# Patient Record
Sex: Male | Born: 1935 | Race: White | Hispanic: No | Marital: Married | Smoking: Never smoker
Health system: Southern US, Community
[De-identification: ages and names within clinical notes are randomized; demographics above are authoritative.]

## PROBLEM LIST (undated history)

## (undated) DIAGNOSIS — R591 Generalized enlarged lymph nodes: Secondary | ICD-10-CM

## (undated) DIAGNOSIS — I712 Thoracic aortic aneurysm, without rupture, unspecified: Secondary | ICD-10-CM

## (undated) DIAGNOSIS — E291 Testicular hypofunction: Secondary | ICD-10-CM

## (undated) DIAGNOSIS — R21 Rash and other nonspecific skin eruption: Secondary | ICD-10-CM

## (undated) DIAGNOSIS — D649 Anemia, unspecified: Secondary | ICD-10-CM

## (undated) DIAGNOSIS — M549 Dorsalgia, unspecified: Secondary | ICD-10-CM

## (undated) DIAGNOSIS — M1990 Unspecified osteoarthritis, unspecified site: Secondary | ICD-10-CM

## (undated) DIAGNOSIS — C61 Malignant neoplasm of prostate: Secondary | ICD-10-CM

## (undated) DIAGNOSIS — I7090 Unspecified atherosclerosis: Secondary | ICD-10-CM

## (undated) DIAGNOSIS — E785 Hyperlipidemia, unspecified: Secondary | ICD-10-CM

## (undated) DIAGNOSIS — R931 Abnormal findings on diagnostic imaging of heart and coronary circulation: Secondary | ICD-10-CM

## (undated) DIAGNOSIS — E669 Obesity, unspecified: Secondary | ICD-10-CM

## (undated) DIAGNOSIS — M8580 Other specified disorders of bone density and structure, unspecified site: Secondary | ICD-10-CM

## (undated) HISTORY — DX: Abnormal findings on diagnostic imaging of heart and coronary circulation: R93.1

## (undated) HISTORY — DX: Anemia, unspecified: D64.9

## (undated) HISTORY — DX: Rash and other nonspecific skin eruption: R21

## (undated) HISTORY — PX: OTHER SURGICAL HISTORY: SHX169

## (undated) HISTORY — DX: Generalized enlarged lymph nodes: R59.1

## (undated) HISTORY — DX: Unspecified atherosclerosis: I70.90

## (undated) HISTORY — DX: Malignant neoplasm of prostate: C61

## (undated) HISTORY — PX: BELPHAROPTOSIS REPAIR: SHX369

## (undated) HISTORY — DX: Other specified disorders of bone density and structure, unspecified site: M85.80

## (undated) HISTORY — DX: Unspecified osteoarthritis, unspecified site: M19.90

## (undated) HISTORY — DX: Dorsalgia, unspecified: M54.9

## (undated) HISTORY — PX: BILATERAL KNEE ARTHROSCOPY: SUR91

## (undated) HISTORY — DX: Thoracic aortic aneurysm, without rupture, unspecified: I71.20

## (undated) HISTORY — DX: Thoracic aortic aneurysm, without rupture: I71.2

## (undated) HISTORY — DX: Testicular hypofunction: E29.1

## (undated) HISTORY — DX: Hyperlipidemia, unspecified: E78.5

## (undated) HISTORY — DX: Obesity, unspecified: E66.9

---

## 1998-09-12 ENCOUNTER — Ambulatory Visit (HOSPITAL_COMMUNITY): Admission: RE | Admit: 1998-09-12 | Discharge: 1998-09-12 | Payer: Self-pay | Admitting: Neurosurgery

## 1998-09-12 ENCOUNTER — Encounter: Payer: Self-pay | Admitting: Neurosurgery

## 1998-09-27 ENCOUNTER — Encounter: Admission: RE | Admit: 1998-09-27 | Discharge: 1998-12-26 | Payer: Self-pay | Admitting: Radiation Oncology

## 1999-03-23 ENCOUNTER — Encounter: Admission: RE | Admit: 1999-03-23 | Discharge: 1999-03-23 | Payer: Self-pay | Admitting: Radiation Oncology

## 1999-05-17 ENCOUNTER — Ambulatory Visit (HOSPITAL_COMMUNITY): Admission: RE | Admit: 1999-05-17 | Discharge: 1999-05-17 | Payer: Self-pay | Admitting: Neurosurgery

## 1999-05-17 ENCOUNTER — Encounter: Payer: Self-pay | Admitting: Neurosurgery

## 2001-06-09 ENCOUNTER — Ambulatory Visit: Admission: RE | Admit: 2001-06-09 | Discharge: 2001-09-07 | Payer: Self-pay | Admitting: Radiation Oncology

## 2001-06-12 ENCOUNTER — Encounter: Payer: Self-pay | Admitting: Urology

## 2001-06-12 ENCOUNTER — Encounter: Admission: RE | Admit: 2001-06-12 | Discharge: 2001-06-12 | Payer: Self-pay | Admitting: Urology

## 2003-12-05 ENCOUNTER — Ambulatory Visit (HOSPITAL_COMMUNITY): Admission: RE | Admit: 2003-12-05 | Discharge: 2003-12-05 | Payer: Self-pay | Admitting: Gastroenterology

## 2008-03-24 ENCOUNTER — Ambulatory Visit: Admission: RE | Admit: 2008-03-24 | Discharge: 2008-04-14 | Payer: Self-pay | Admitting: Radiation Oncology

## 2009-06-15 ENCOUNTER — Encounter: Admission: RE | Admit: 2009-06-15 | Discharge: 2009-06-15 | Payer: Self-pay | Admitting: Internal Medicine

## 2010-06-29 NOTE — Op Note (Signed)
NAMELERON, STOFFERS NO.:  [id number]   MEDICAL RECORD NO.:  [id number]          PATIENT TYPE:  AMB   LOCATION:  ENDO                         FACILITY:  [facility]   PHYSICIAN:  Danise Edge, M.D.   DATE OF BIRTH:  11-15-1935   DATE OF PROCEDURE:  12/05/2003  DATE OF DISCHARGE:                                 OPERATIVE REPORT   PROCEDURE:  Screening colonoscopy.   INDICATIONS FOR PROCEDURE:  Mr. Nicolas Woodward is a 75 year old male with  metastatic prostate cancer. His date of birth is [birth year].  He is  scheduled to undergo his first screening colonoscopy with polypectomy to  prevent colon cancer.   ENDOSCOPIST:  Danise Edge, M.D.   PREMEDICATION:  Versed 6 mg, Demerol 90 mg, Narcan 0.4 mg.   DESCRIPTION OF PROCEDURE:  After obtaining informed consent, Mr. Nicolas Woodward was  placed in the left lateral decubitus position. I administered intravenous  Demerol and intravenous Versed to achieve conscious sedation for the  procedure. The patient's blood pressure, oxygen saturation and cardiac  rhythm were monitored throughout the procedure and documented in the medical  record.   Anal inspection and digital rectal exam were normal. The prostate is absent.  The Olympus adjustable pediatric colonoscope was introduced into the rectum  and advanced to the cecum. Colonic preparation for the exam today was  excellent.   During the procedure, Nicolas Woodward's oxygen saturation dropped to 88%.  He was  arousable.  Narcan was given because I was in the cecum and felt that by  reversing the Demerol he would be able to take deeper breaths which actually  occurred.  He maintained his oxygen saturation above 90% the remainder of  the procedure.   RECTUM:  Normal.  Large internal hemorrhoids are noted.   SIGMOID COLON AND DESCENDING COLON:  Left colonic diverticulosis.   SPLENIC FLEXURE:  Normal.   TRANSVERSE COLON:  Normal.   HEPATIC FLEXURE:  Normal.   ASCENDING COLON:   Normal.   CECUM AND ILEOCECAL VALVE:  Normal.   ASSESSMENT:  Left colonic diverticulosis.  Otherwise normal screening  proctocolonoscopy to the cecum.      MJ/MEDQ  D:  12/05/2003  T:  12/05/2003  Job:  [id number]   cc:   Wilson Singer, M.D.  [address], [address]  [place]  [place] [address]  Fax: [phone]

## 2011-07-09 ENCOUNTER — Other Ambulatory Visit (HOSPITAL_COMMUNITY): Payer: Self-pay | Admitting: Internal Medicine

## 2011-07-09 DIAGNOSIS — C61 Malignant neoplasm of prostate: Secondary | ICD-10-CM

## 2011-07-09 DIAGNOSIS — R1031 Right lower quadrant pain: Secondary | ICD-10-CM

## 2011-07-12 ENCOUNTER — Encounter (HOSPITAL_COMMUNITY)
Admission: RE | Admit: 2011-07-12 | Discharge: 2011-07-12 | Disposition: A | Discharge status: Home / Self Care | Payer: Medicare Other | Source: Ambulatory Visit | Attending: Internal Medicine | Admitting: Internal Medicine

## 2011-07-12 ENCOUNTER — Ambulatory Visit (HOSPITAL_COMMUNITY)
Admission: RE | Admit: 2011-07-12 | Discharge: 2011-07-12 | Disposition: A | Discharge status: Home / Self Care | Payer: Medicare Other | Source: Ambulatory Visit | Attending: Internal Medicine | Admitting: Internal Medicine

## 2011-07-12 DIAGNOSIS — C61 Malignant neoplasm of prostate: Secondary | ICD-10-CM | POA: Insufficient documentation

## 2011-07-12 DIAGNOSIS — K5730 Diverticulosis of large intestine without perforation or abscess without bleeding: Secondary | ICD-10-CM | POA: Insufficient documentation

## 2011-07-12 DIAGNOSIS — Q619 Cystic kidney disease, unspecified: Secondary | ICD-10-CM | POA: Insufficient documentation

## 2011-07-12 DIAGNOSIS — R1031 Right lower quadrant pain: Secondary | ICD-10-CM | POA: Insufficient documentation

## 2011-07-12 DIAGNOSIS — R1903 Right lower quadrant abdominal swelling, mass and lump: Secondary | ICD-10-CM | POA: Insufficient documentation

## 2011-07-12 DIAGNOSIS — M47817 Spondylosis without myelopathy or radiculopathy, lumbosacral region: Secondary | ICD-10-CM | POA: Insufficient documentation

## 2011-07-12 MED ORDER — TECHNETIUM TC 99M MEDRONATE IV KIT
25.0000 | PACK | Freq: Once | INTRAVENOUS | Status: AC | PRN
  Administered 2011-07-12: 25 via INTRAVENOUS

## 2011-07-12 MED ORDER — IOHEXOL 300 MG/ML  SOLN
100.0000 mL | Freq: Once | INTRAMUSCULAR | Status: AC | PRN
  Administered 2011-07-12: 100 mL via INTRAVENOUS

## 2013-01-25 DIAGNOSIS — C7951 Secondary malignant neoplasm of bone: Secondary | ICD-10-CM | POA: Diagnosis not present

## 2013-01-25 DIAGNOSIS — C61 Malignant neoplasm of prostate: Secondary | ICD-10-CM | POA: Diagnosis not present

## 2013-01-25 DIAGNOSIS — Z006 Encounter for examination for normal comparison and control in clinical research program: Secondary | ICD-10-CM | POA: Diagnosis not present

## 2013-01-25 DIAGNOSIS — I10 Essential (primary) hypertension: Secondary | ICD-10-CM | POA: Diagnosis not present

## 2013-01-25 DIAGNOSIS — C779 Secondary and unspecified malignant neoplasm of lymph node, unspecified: Secondary | ICD-10-CM | POA: Diagnosis not present

## 2013-01-25 DIAGNOSIS — E785 Hyperlipidemia, unspecified: Secondary | ICD-10-CM | POA: Diagnosis not present

## 2013-01-25 DIAGNOSIS — Z79899 Other long term (current) drug therapy: Secondary | ICD-10-CM | POA: Diagnosis not present

## 2013-02-15 DIAGNOSIS — R599 Enlarged lymph nodes, unspecified: Secondary | ICD-10-CM | POA: Diagnosis not present

## 2013-02-15 DIAGNOSIS — Y9210 Unspecified residential institution as the place of occurrence of the external cause: Secondary | ICD-10-CM | POA: Diagnosis not present

## 2013-02-15 DIAGNOSIS — K660 Peritoneal adhesions (postprocedural) (postinfection): Secondary | ICD-10-CM | POA: Diagnosis not present

## 2013-02-15 DIAGNOSIS — IMO0002 Reserved for concepts with insufficient information to code with codable children: Secondary | ICD-10-CM | POA: Diagnosis not present

## 2013-02-15 DIAGNOSIS — C61 Malignant neoplasm of prostate: Secondary | ICD-10-CM | POA: Diagnosis not present

## 2013-03-03 DIAGNOSIS — C7951 Secondary malignant neoplasm of bone: Secondary | ICD-10-CM | POA: Diagnosis not present

## 2013-03-03 DIAGNOSIS — C61 Malignant neoplasm of prostate: Secondary | ICD-10-CM | POA: Diagnosis not present

## 2013-03-03 DIAGNOSIS — C779 Secondary and unspecified malignant neoplasm of lymph node, unspecified: Secondary | ICD-10-CM | POA: Diagnosis not present

## 2013-03-03 DIAGNOSIS — C775 Secondary and unspecified malignant neoplasm of intrapelvic lymph nodes: Secondary | ICD-10-CM | POA: Diagnosis not present

## 2013-03-03 DIAGNOSIS — Z5181 Encounter for therapeutic drug level monitoring: Secondary | ICD-10-CM | POA: Diagnosis not present

## 2013-03-03 DIAGNOSIS — R933 Abnormal findings on diagnostic imaging of other parts of digestive tract: Secondary | ICD-10-CM | POA: Diagnosis not present

## 2013-03-09 DIAGNOSIS — L259 Unspecified contact dermatitis, unspecified cause: Secondary | ICD-10-CM | POA: Diagnosis not present

## 2013-03-09 DIAGNOSIS — Z85828 Personal history of other malignant neoplasm of skin: Secondary | ICD-10-CM | POA: Diagnosis not present

## 2013-03-17 DIAGNOSIS — C61 Malignant neoplasm of prostate: Secondary | ICD-10-CM | POA: Diagnosis not present

## 2013-03-17 DIAGNOSIS — C7951 Secondary malignant neoplasm of bone: Secondary | ICD-10-CM | POA: Diagnosis not present

## 2013-03-17 DIAGNOSIS — Z5181 Encounter for therapeutic drug level monitoring: Secondary | ICD-10-CM | POA: Diagnosis not present

## 2013-03-17 DIAGNOSIS — C779 Secondary and unspecified malignant neoplasm of lymph node, unspecified: Secondary | ICD-10-CM | POA: Diagnosis not present

## 2013-03-17 DIAGNOSIS — C801 Malignant (primary) neoplasm, unspecified: Secondary | ICD-10-CM | POA: Diagnosis not present

## 2013-04-14 DIAGNOSIS — C61 Malignant neoplasm of prostate: Secondary | ICD-10-CM | POA: Diagnosis not present

## 2013-04-14 DIAGNOSIS — C779 Secondary and unspecified malignant neoplasm of lymph node, unspecified: Secondary | ICD-10-CM | POA: Diagnosis not present

## 2013-04-14 DIAGNOSIS — Z5181 Encounter for therapeutic drug level monitoring: Secondary | ICD-10-CM | POA: Diagnosis not present

## 2013-04-14 DIAGNOSIS — C7951 Secondary malignant neoplasm of bone: Secondary | ICD-10-CM | POA: Diagnosis not present

## 2013-05-10 DIAGNOSIS — C61 Malignant neoplasm of prostate: Secondary | ICD-10-CM | POA: Diagnosis not present

## 2013-05-10 DIAGNOSIS — N3946 Mixed incontinence: Secondary | ICD-10-CM | POA: Diagnosis not present

## 2013-05-19 DIAGNOSIS — Z79899 Other long term (current) drug therapy: Secondary | ICD-10-CM | POA: Diagnosis not present

## 2013-05-19 DIAGNOSIS — C779 Secondary and unspecified malignant neoplasm of lymph node, unspecified: Secondary | ICD-10-CM | POA: Diagnosis not present

## 2013-05-19 DIAGNOSIS — I10 Essential (primary) hypertension: Secondary | ICD-10-CM | POA: Diagnosis not present

## 2013-05-19 DIAGNOSIS — Z9079 Acquired absence of other genital organ(s): Secondary | ICD-10-CM | POA: Diagnosis not present

## 2013-05-19 DIAGNOSIS — E785 Hyperlipidemia, unspecified: Secondary | ICD-10-CM | POA: Diagnosis not present

## 2013-05-19 DIAGNOSIS — C61 Malignant neoplasm of prostate: Secondary | ICD-10-CM | POA: Diagnosis not present

## 2013-06-03 DIAGNOSIS — N3946 Mixed incontinence: Secondary | ICD-10-CM | POA: Diagnosis not present

## 2013-06-14 DIAGNOSIS — N3946 Mixed incontinence: Secondary | ICD-10-CM | POA: Diagnosis not present

## 2013-06-14 DIAGNOSIS — C61 Malignant neoplasm of prostate: Secondary | ICD-10-CM | POA: Diagnosis not present

## 2013-06-16 DIAGNOSIS — C61 Malignant neoplasm of prostate: Secondary | ICD-10-CM | POA: Diagnosis not present

## 2013-06-16 DIAGNOSIS — IMO0001 Reserved for inherently not codable concepts without codable children: Secondary | ICD-10-CM | POA: Diagnosis not present

## 2013-06-16 DIAGNOSIS — R32 Unspecified urinary incontinence: Secondary | ICD-10-CM | POA: Diagnosis not present

## 2013-06-16 DIAGNOSIS — Z5181 Encounter for therapeutic drug level monitoring: Secondary | ICD-10-CM | POA: Diagnosis not present

## 2013-06-16 DIAGNOSIS — M899 Disorder of bone, unspecified: Secondary | ICD-10-CM | POA: Diagnosis not present

## 2013-06-16 DIAGNOSIS — C779 Secondary and unspecified malignant neoplasm of lymph node, unspecified: Secondary | ICD-10-CM | POA: Diagnosis not present

## 2013-06-16 DIAGNOSIS — C775 Secondary and unspecified malignant neoplasm of intrapelvic lymph nodes: Secondary | ICD-10-CM | POA: Diagnosis not present

## 2013-06-16 DIAGNOSIS — C7951 Secondary malignant neoplasm of bone: Secondary | ICD-10-CM | POA: Diagnosis not present

## 2013-06-16 DIAGNOSIS — Z79899 Other long term (current) drug therapy: Secondary | ICD-10-CM | POA: Diagnosis not present

## 2013-06-16 DIAGNOSIS — M949 Disorder of cartilage, unspecified: Secondary | ICD-10-CM | POA: Diagnosis not present

## 2013-07-30 DIAGNOSIS — M949 Disorder of cartilage, unspecified: Secondary | ICD-10-CM | POA: Diagnosis not present

## 2013-07-30 DIAGNOSIS — I10 Essential (primary) hypertension: Secondary | ICD-10-CM | POA: Diagnosis not present

## 2013-07-30 DIAGNOSIS — M899 Disorder of bone, unspecified: Secondary | ICD-10-CM | POA: Diagnosis not present

## 2013-07-30 DIAGNOSIS — Z125 Encounter for screening for malignant neoplasm of prostate: Secondary | ICD-10-CM | POA: Diagnosis not present

## 2013-07-30 DIAGNOSIS — E785 Hyperlipidemia, unspecified: Secondary | ICD-10-CM | POA: Diagnosis not present

## 2013-08-06 DIAGNOSIS — M899 Disorder of bone, unspecified: Secondary | ICD-10-CM | POA: Diagnosis not present

## 2013-08-06 DIAGNOSIS — Z0000 Encounter for general adult medical examination without abnormal findings: Secondary | ICD-10-CM | POA: Diagnosis not present

## 2013-08-06 DIAGNOSIS — Z23 Encounter for immunization: Secondary | ICD-10-CM | POA: Diagnosis not present

## 2013-08-06 DIAGNOSIS — Z1331 Encounter for screening for depression: Secondary | ICD-10-CM | POA: Diagnosis not present

## 2013-08-06 DIAGNOSIS — E785 Hyperlipidemia, unspecified: Secondary | ICD-10-CM | POA: Diagnosis not present

## 2013-08-06 DIAGNOSIS — I10 Essential (primary) hypertension: Secondary | ICD-10-CM | POA: Diagnosis not present

## 2013-08-06 DIAGNOSIS — M949 Disorder of cartilage, unspecified: Secondary | ICD-10-CM | POA: Diagnosis not present

## 2013-08-06 DIAGNOSIS — K5909 Other constipation: Secondary | ICD-10-CM | POA: Diagnosis not present

## 2013-08-06 DIAGNOSIS — E291 Testicular hypofunction: Secondary | ICD-10-CM | POA: Diagnosis not present

## 2013-09-15 DIAGNOSIS — R937 Abnormal findings on diagnostic imaging of other parts of musculoskeletal system: Secondary | ICD-10-CM | POA: Diagnosis not present

## 2013-09-15 DIAGNOSIS — Z5181 Encounter for therapeutic drug level monitoring: Secondary | ICD-10-CM | POA: Diagnosis not present

## 2013-09-15 DIAGNOSIS — Z79899 Other long term (current) drug therapy: Secondary | ICD-10-CM | POA: Diagnosis not present

## 2013-09-15 DIAGNOSIS — C779 Secondary and unspecified malignant neoplasm of lymph node, unspecified: Secondary | ICD-10-CM | POA: Diagnosis not present

## 2013-09-15 DIAGNOSIS — R918 Other nonspecific abnormal finding of lung field: Secondary | ICD-10-CM | POA: Diagnosis not present

## 2013-09-15 DIAGNOSIS — C61 Malignant neoplasm of prostate: Secondary | ICD-10-CM | POA: Diagnosis not present

## 2013-09-15 DIAGNOSIS — Z9079 Acquired absence of other genital organ(s): Secondary | ICD-10-CM | POA: Diagnosis not present

## 2013-09-24 DIAGNOSIS — H43399 Other vitreous opacities, unspecified eye: Secondary | ICD-10-CM | POA: Diagnosis not present

## 2013-12-20 DIAGNOSIS — N320 Bladder-neck obstruction: Secondary | ICD-10-CM | POA: Diagnosis not present

## 2013-12-20 DIAGNOSIS — C61 Malignant neoplasm of prostate: Secondary | ICD-10-CM | POA: Diagnosis not present

## 2013-12-22 DIAGNOSIS — R911 Solitary pulmonary nodule: Secondary | ICD-10-CM | POA: Diagnosis not present

## 2013-12-22 DIAGNOSIS — C61 Malignant neoplasm of prostate: Secondary | ICD-10-CM | POA: Diagnosis not present

## 2013-12-22 DIAGNOSIS — Z79899 Other long term (current) drug therapy: Secondary | ICD-10-CM | POA: Diagnosis not present

## 2013-12-22 DIAGNOSIS — C779 Secondary and unspecified malignant neoplasm of lymph node, unspecified: Secondary | ICD-10-CM | POA: Diagnosis not present

## 2013-12-22 DIAGNOSIS — C7951 Secondary malignant neoplasm of bone: Secondary | ICD-10-CM | POA: Diagnosis not present

## 2013-12-22 DIAGNOSIS — Z5181 Encounter for therapeutic drug level monitoring: Secondary | ICD-10-CM | POA: Diagnosis not present

## 2013-12-22 DIAGNOSIS — K5730 Diverticulosis of large intestine without perforation or abscess without bleeding: Secondary | ICD-10-CM | POA: Diagnosis not present

## 2014-03-16 DIAGNOSIS — C779 Secondary and unspecified malignant neoplasm of lymph node, unspecified: Secondary | ICD-10-CM | POA: Diagnosis not present

## 2014-03-16 DIAGNOSIS — C7951 Secondary malignant neoplasm of bone: Secondary | ICD-10-CM | POA: Diagnosis not present

## 2014-03-16 DIAGNOSIS — Z5181 Encounter for therapeutic drug level monitoring: Secondary | ICD-10-CM | POA: Diagnosis not present

## 2014-03-16 DIAGNOSIS — C61 Malignant neoplasm of prostate: Secondary | ICD-10-CM | POA: Diagnosis not present

## 2014-03-16 DIAGNOSIS — Z79899 Other long term (current) drug therapy: Secondary | ICD-10-CM | POA: Diagnosis not present

## 2014-03-16 DIAGNOSIS — R918 Other nonspecific abnormal finding of lung field: Secondary | ICD-10-CM | POA: Diagnosis not present

## 2014-03-31 DIAGNOSIS — M5136 Other intervertebral disc degeneration, lumbar region: Secondary | ICD-10-CM | POA: Diagnosis not present

## 2014-03-31 DIAGNOSIS — M4310 Spondylolisthesis, site unspecified: Secondary | ICD-10-CM | POA: Diagnosis not present

## 2014-04-13 DIAGNOSIS — M5136 Other intervertebral disc degeneration, lumbar region: Secondary | ICD-10-CM | POA: Diagnosis not present

## 2014-04-13 DIAGNOSIS — M4316 Spondylolisthesis, lumbar region: Secondary | ICD-10-CM | POA: Diagnosis not present

## 2014-05-30 DIAGNOSIS — C61 Malignant neoplasm of prostate: Secondary | ICD-10-CM | POA: Diagnosis not present

## 2014-06-15 DIAGNOSIS — R32 Unspecified urinary incontinence: Secondary | ICD-10-CM | POA: Diagnosis not present

## 2014-06-15 DIAGNOSIS — K5900 Constipation, unspecified: Secondary | ICD-10-CM | POA: Diagnosis not present

## 2014-06-15 DIAGNOSIS — C7951 Secondary malignant neoplasm of bone: Secondary | ICD-10-CM | POA: Diagnosis not present

## 2014-06-15 DIAGNOSIS — Z5181 Encounter for therapeutic drug level monitoring: Secondary | ICD-10-CM | POA: Diagnosis not present

## 2014-06-15 DIAGNOSIS — Z79899 Other long term (current) drug therapy: Secondary | ICD-10-CM | POA: Diagnosis not present

## 2014-06-15 DIAGNOSIS — C61 Malignant neoplasm of prostate: Secondary | ICD-10-CM | POA: Diagnosis not present

## 2014-06-15 DIAGNOSIS — C779 Secondary and unspecified malignant neoplasm of lymph node, unspecified: Secondary | ICD-10-CM | POA: Diagnosis not present

## 2014-07-29 DIAGNOSIS — H2513 Age-related nuclear cataract, bilateral: Secondary | ICD-10-CM | POA: Diagnosis not present

## 2014-07-29 DIAGNOSIS — D3132 Benign neoplasm of left choroid: Secondary | ICD-10-CM | POA: Diagnosis not present

## 2014-07-29 DIAGNOSIS — H43812 Vitreous degeneration, left eye: Secondary | ICD-10-CM | POA: Diagnosis not present

## 2014-07-29 DIAGNOSIS — H11823 Conjunctivochalasis, bilateral: Secondary | ICD-10-CM | POA: Diagnosis not present

## 2014-07-29 DIAGNOSIS — H02834 Dermatochalasis of left upper eyelid: Secondary | ICD-10-CM | POA: Diagnosis not present

## 2014-07-29 DIAGNOSIS — H02831 Dermatochalasis of right upper eyelid: Secondary | ICD-10-CM | POA: Diagnosis not present

## 2014-08-04 DIAGNOSIS — I10 Essential (primary) hypertension: Secondary | ICD-10-CM | POA: Diagnosis not present

## 2014-08-04 DIAGNOSIS — Z125 Encounter for screening for malignant neoplasm of prostate: Secondary | ICD-10-CM | POA: Diagnosis not present

## 2014-08-04 DIAGNOSIS — M859 Disorder of bone density and structure, unspecified: Secondary | ICD-10-CM | POA: Diagnosis not present

## 2014-08-04 DIAGNOSIS — E785 Hyperlipidemia, unspecified: Secondary | ICD-10-CM | POA: Diagnosis not present

## 2014-08-11 DIAGNOSIS — Z0000 Encounter for general adult medical examination without abnormal findings: Secondary | ICD-10-CM | POA: Diagnosis not present

## 2014-08-11 DIAGNOSIS — Z1389 Encounter for screening for other disorder: Secondary | ICD-10-CM | POA: Diagnosis not present

## 2014-08-11 DIAGNOSIS — M8580 Other specified disorders of bone density and structure, unspecified site: Secondary | ICD-10-CM | POA: Diagnosis not present

## 2014-08-11 DIAGNOSIS — I10 Essential (primary) hypertension: Secondary | ICD-10-CM | POA: Diagnosis not present

## 2014-08-11 DIAGNOSIS — E785 Hyperlipidemia, unspecified: Secondary | ICD-10-CM | POA: Diagnosis not present

## 2014-08-11 DIAGNOSIS — E291 Testicular hypofunction: Secondary | ICD-10-CM | POA: Diagnosis not present

## 2014-08-11 DIAGNOSIS — E669 Obesity, unspecified: Secondary | ICD-10-CM | POA: Diagnosis not present

## 2014-08-11 DIAGNOSIS — Z1212 Encounter for screening for malignant neoplasm of rectum: Secondary | ICD-10-CM | POA: Diagnosis not present

## 2014-08-11 DIAGNOSIS — Z6833 Body mass index (BMI) 33.0-33.9, adult: Secondary | ICD-10-CM | POA: Diagnosis not present

## 2014-08-11 DIAGNOSIS — C61 Malignant neoplasm of prostate: Secondary | ICD-10-CM | POA: Diagnosis not present

## 2014-08-25 DIAGNOSIS — H01006 Unspecified blepharitis left eye, unspecified eyelid: Secondary | ICD-10-CM | POA: Diagnosis not present

## 2014-08-25 DIAGNOSIS — H04203 Unspecified epiphora, bilateral lacrimal glands: Secondary | ICD-10-CM | POA: Diagnosis not present

## 2014-08-25 DIAGNOSIS — H11823 Conjunctivochalasis, bilateral: Secondary | ICD-10-CM | POA: Diagnosis not present

## 2014-08-25 DIAGNOSIS — H019 Unspecified inflammation of eyelid: Secondary | ICD-10-CM | POA: Diagnosis not present

## 2014-08-25 DIAGNOSIS — H02833 Dermatochalasis of right eye, unspecified eyelid: Secondary | ICD-10-CM | POA: Diagnosis not present

## 2014-08-25 DIAGNOSIS — H2513 Age-related nuclear cataract, bilateral: Secondary | ICD-10-CM | POA: Diagnosis not present

## 2014-08-25 DIAGNOSIS — H02836 Dermatochalasis of left eye, unspecified eyelid: Secondary | ICD-10-CM | POA: Diagnosis not present

## 2014-08-25 DIAGNOSIS — H01003 Unspecified blepharitis right eye, unspecified eyelid: Secondary | ICD-10-CM | POA: Diagnosis not present

## 2014-08-25 DIAGNOSIS — H04223 Epiphora due to insufficient drainage, bilateral lacrimal glands: Secondary | ICD-10-CM | POA: Diagnosis not present

## 2014-09-07 DIAGNOSIS — C7951 Secondary malignant neoplasm of bone: Secondary | ICD-10-CM | POA: Diagnosis not present

## 2014-09-07 DIAGNOSIS — Z5181 Encounter for therapeutic drug level monitoring: Secondary | ICD-10-CM | POA: Diagnosis not present

## 2014-09-07 DIAGNOSIS — C61 Malignant neoplasm of prostate: Secondary | ICD-10-CM | POA: Diagnosis not present

## 2014-09-07 DIAGNOSIS — Z79899 Other long term (current) drug therapy: Secondary | ICD-10-CM | POA: Diagnosis not present

## 2014-09-07 DIAGNOSIS — R918 Other nonspecific abnormal finding of lung field: Secondary | ICD-10-CM | POA: Diagnosis not present

## 2014-09-07 DIAGNOSIS — C779 Secondary and unspecified malignant neoplasm of lymph node, unspecified: Secondary | ICD-10-CM | POA: Diagnosis not present

## 2014-09-15 DIAGNOSIS — H01005 Unspecified blepharitis left lower eyelid: Secondary | ICD-10-CM | POA: Diagnosis not present

## 2014-09-15 DIAGNOSIS — H01003 Unspecified blepharitis right eye, unspecified eyelid: Secondary | ICD-10-CM | POA: Diagnosis not present

## 2014-09-15 DIAGNOSIS — H01002 Unspecified blepharitis right lower eyelid: Secondary | ICD-10-CM | POA: Diagnosis not present

## 2014-09-15 DIAGNOSIS — H02833 Dermatochalasis of right eye, unspecified eyelid: Secondary | ICD-10-CM | POA: Diagnosis not present

## 2014-09-15 DIAGNOSIS — H04223 Epiphora due to insufficient drainage, bilateral lacrimal glands: Secondary | ICD-10-CM | POA: Diagnosis not present

## 2014-09-15 DIAGNOSIS — H01001 Unspecified blepharitis right upper eyelid: Secondary | ICD-10-CM | POA: Diagnosis not present

## 2014-09-15 DIAGNOSIS — H2513 Age-related nuclear cataract, bilateral: Secondary | ICD-10-CM | POA: Diagnosis not present

## 2014-09-15 DIAGNOSIS — H01004 Unspecified blepharitis left upper eyelid: Secondary | ICD-10-CM | POA: Diagnosis not present

## 2014-09-15 DIAGNOSIS — H11823 Conjunctivochalasis, bilateral: Secondary | ICD-10-CM | POA: Diagnosis not present

## 2014-09-15 DIAGNOSIS — H019 Unspecified inflammation of eyelid: Secondary | ICD-10-CM | POA: Diagnosis not present

## 2014-09-15 DIAGNOSIS — H01006 Unspecified blepharitis left eye, unspecified eyelid: Secondary | ICD-10-CM | POA: Diagnosis not present

## 2014-09-15 DIAGNOSIS — H02836 Dermatochalasis of left eye, unspecified eyelid: Secondary | ICD-10-CM | POA: Diagnosis not present

## 2014-11-09 ENCOUNTER — Other Ambulatory Visit (HOSPITAL_COMMUNITY): Payer: Self-pay | Admitting: Gastroenterology

## 2014-11-09 DIAGNOSIS — Z1211 Encounter for screening for malignant neoplasm of colon: Secondary | ICD-10-CM | POA: Diagnosis not present

## 2014-11-09 DIAGNOSIS — K5730 Diverticulosis of large intestine without perforation or abscess without bleeding: Secondary | ICD-10-CM | POA: Diagnosis not present

## 2014-11-09 DIAGNOSIS — K640 First degree hemorrhoids: Secondary | ICD-10-CM | POA: Diagnosis not present

## 2014-11-10 ENCOUNTER — Ambulatory Visit (HOSPITAL_COMMUNITY)
Admission: RE | Admit: 2014-11-10 | Discharge: 2014-11-10 | Disposition: A | Discharge status: Home / Self Care | Payer: Medicare Other | Source: Ambulatory Visit | Attending: Gastroenterology | Admitting: Gastroenterology

## 2014-11-10 DIAGNOSIS — Z1211 Encounter for screening for malignant neoplasm of colon: Secondary | ICD-10-CM | POA: Diagnosis not present

## 2014-11-10 DIAGNOSIS — R159 Full incontinence of feces: Secondary | ICD-10-CM | POA: Insufficient documentation

## 2014-11-10 DIAGNOSIS — K5730 Diverticulosis of large intestine without perforation or abscess without bleeding: Secondary | ICD-10-CM | POA: Diagnosis not present

## 2014-11-10 DIAGNOSIS — C61 Malignant neoplasm of prostate: Secondary | ICD-10-CM | POA: Insufficient documentation

## 2014-12-06 DIAGNOSIS — H01025 Squamous blepharitis left lower eyelid: Secondary | ICD-10-CM | POA: Diagnosis not present

## 2014-12-06 DIAGNOSIS — H2513 Age-related nuclear cataract, bilateral: Secondary | ICD-10-CM | POA: Diagnosis not present

## 2014-12-06 DIAGNOSIS — H01021 Squamous blepharitis right upper eyelid: Secondary | ICD-10-CM | POA: Diagnosis not present

## 2014-12-06 DIAGNOSIS — H02834 Dermatochalasis of left upper eyelid: Secondary | ICD-10-CM | POA: Diagnosis not present

## 2014-12-06 DIAGNOSIS — H01024 Squamous blepharitis left upper eyelid: Secondary | ICD-10-CM | POA: Diagnosis not present

## 2014-12-06 DIAGNOSIS — H25813 Combined forms of age-related cataract, bilateral: Secondary | ICD-10-CM | POA: Diagnosis not present

## 2014-12-06 DIAGNOSIS — H43812 Vitreous degeneration, left eye: Secondary | ICD-10-CM | POA: Diagnosis not present

## 2014-12-06 DIAGNOSIS — H11823 Conjunctivochalasis, bilateral: Secondary | ICD-10-CM | POA: Diagnosis not present

## 2014-12-06 DIAGNOSIS — D3132 Benign neoplasm of left choroid: Secondary | ICD-10-CM | POA: Diagnosis not present

## 2014-12-06 DIAGNOSIS — H01022 Squamous blepharitis right lower eyelid: Secondary | ICD-10-CM | POA: Diagnosis not present

## 2014-12-06 DIAGNOSIS — H02831 Dermatochalasis of right upper eyelid: Secondary | ICD-10-CM | POA: Diagnosis not present

## 2014-12-12 DIAGNOSIS — Z23 Encounter for immunization: Secondary | ICD-10-CM | POA: Diagnosis not present

## 2014-12-12 DIAGNOSIS — C61 Malignant neoplasm of prostate: Secondary | ICD-10-CM | POA: Diagnosis not present

## 2014-12-14 DIAGNOSIS — C61 Malignant neoplasm of prostate: Secondary | ICD-10-CM | POA: Diagnosis not present

## 2014-12-14 DIAGNOSIS — C7951 Secondary malignant neoplasm of bone: Secondary | ICD-10-CM | POA: Diagnosis not present

## 2014-12-14 DIAGNOSIS — Z79899 Other long term (current) drug therapy: Secondary | ICD-10-CM | POA: Diagnosis not present

## 2014-12-14 DIAGNOSIS — C779 Secondary and unspecified malignant neoplasm of lymph node, unspecified: Secondary | ICD-10-CM | POA: Diagnosis not present

## 2014-12-14 DIAGNOSIS — Z5181 Encounter for therapeutic drug level monitoring: Secondary | ICD-10-CM | POA: Diagnosis not present

## 2014-12-22 DIAGNOSIS — H2512 Age-related nuclear cataract, left eye: Secondary | ICD-10-CM | POA: Diagnosis not present

## 2014-12-22 DIAGNOSIS — H52222 Regular astigmatism, left eye: Secondary | ICD-10-CM | POA: Diagnosis not present

## 2015-01-23 DIAGNOSIS — H2511 Age-related nuclear cataract, right eye: Secondary | ICD-10-CM | POA: Diagnosis not present

## 2015-01-26 DIAGNOSIS — H2511 Age-related nuclear cataract, right eye: Secondary | ICD-10-CM | POA: Diagnosis not present

## 2015-01-26 DIAGNOSIS — H52221 Regular astigmatism, right eye: Secondary | ICD-10-CM | POA: Diagnosis not present

## 2015-03-15 DIAGNOSIS — C61 Malignant neoplasm of prostate: Secondary | ICD-10-CM | POA: Diagnosis not present

## 2015-03-15 DIAGNOSIS — Z5181 Encounter for therapeutic drug level monitoring: Secondary | ICD-10-CM | POA: Diagnosis not present

## 2015-03-15 DIAGNOSIS — C779 Secondary and unspecified malignant neoplasm of lymph node, unspecified: Secondary | ICD-10-CM | POA: Diagnosis not present

## 2015-03-15 DIAGNOSIS — C772 Secondary and unspecified malignant neoplasm of intra-abdominal lymph nodes: Secondary | ICD-10-CM | POA: Diagnosis not present

## 2015-03-15 DIAGNOSIS — Z79899 Other long term (current) drug therapy: Secondary | ICD-10-CM | POA: Diagnosis not present

## 2015-03-15 DIAGNOSIS — M1280 Other specific arthropathies, not elsewhere classified, unspecified site: Secondary | ICD-10-CM | POA: Diagnosis not present

## 2015-03-15 DIAGNOSIS — C7951 Secondary malignant neoplasm of bone: Secondary | ICD-10-CM | POA: Diagnosis not present

## 2015-03-29 DIAGNOSIS — Z923 Personal history of irradiation: Secondary | ICD-10-CM | POA: Diagnosis not present

## 2015-03-29 DIAGNOSIS — Z9079 Acquired absence of other genital organ(s): Secondary | ICD-10-CM | POA: Diagnosis not present

## 2015-03-29 DIAGNOSIS — C772 Secondary and unspecified malignant neoplasm of intra-abdominal lymph nodes: Secondary | ICD-10-CM | POA: Diagnosis not present

## 2015-03-29 DIAGNOSIS — Z5111 Encounter for antineoplastic chemotherapy: Secondary | ICD-10-CM | POA: Diagnosis not present

## 2015-03-29 DIAGNOSIS — C61 Malignant neoplasm of prostate: Secondary | ICD-10-CM | POA: Diagnosis not present

## 2015-04-03 DIAGNOSIS — C61 Malignant neoplasm of prostate: Secondary | ICD-10-CM | POA: Diagnosis not present

## 2015-04-03 DIAGNOSIS — R509 Fever, unspecified: Secondary | ICD-10-CM | POA: Diagnosis not present

## 2015-04-03 DIAGNOSIS — J029 Acute pharyngitis, unspecified: Secondary | ICD-10-CM | POA: Diagnosis not present

## 2015-04-03 DIAGNOSIS — Z6830 Body mass index (BMI) 30.0-30.9, adult: Secondary | ICD-10-CM | POA: Diagnosis not present

## 2015-04-03 DIAGNOSIS — R05 Cough: Secondary | ICD-10-CM | POA: Diagnosis not present

## 2015-04-07 DIAGNOSIS — C61 Malignant neoplasm of prostate: Secondary | ICD-10-CM | POA: Diagnosis not present

## 2015-04-19 DIAGNOSIS — C61 Malignant neoplasm of prostate: Secondary | ICD-10-CM | POA: Diagnosis not present

## 2015-04-19 DIAGNOSIS — Z5111 Encounter for antineoplastic chemotherapy: Secondary | ICD-10-CM | POA: Diagnosis not present

## 2015-04-19 DIAGNOSIS — C772 Secondary and unspecified malignant neoplasm of intra-abdominal lymph nodes: Secondary | ICD-10-CM | POA: Diagnosis not present

## 2015-05-10 DIAGNOSIS — Z79899 Other long term (current) drug therapy: Secondary | ICD-10-CM | POA: Diagnosis not present

## 2015-05-10 DIAGNOSIS — C61 Malignant neoplasm of prostate: Secondary | ICD-10-CM | POA: Diagnosis not present

## 2015-05-10 DIAGNOSIS — C772 Secondary and unspecified malignant neoplasm of intra-abdominal lymph nodes: Secondary | ICD-10-CM | POA: Diagnosis not present

## 2015-05-10 DIAGNOSIS — C7951 Secondary malignant neoplasm of bone: Secondary | ICD-10-CM | POA: Diagnosis not present

## 2015-05-10 DIAGNOSIS — Z7982 Long term (current) use of aspirin: Secondary | ICD-10-CM | POA: Diagnosis not present

## 2015-05-10 DIAGNOSIS — Z5111 Encounter for antineoplastic chemotherapy: Secondary | ICD-10-CM | POA: Diagnosis not present

## 2015-05-16 ENCOUNTER — Ambulatory Visit (INDEPENDENT_AMBULATORY_CARE_PROVIDER_SITE_OTHER): Payer: Medicare Other | Admitting: Podiatry

## 2015-05-16 ENCOUNTER — Encounter: Payer: Self-pay | Admitting: Podiatry

## 2015-05-16 VITALS — BP 119/77 | HR 82 | Ht 72.0 in | Wt 244.0 lb

## 2015-05-16 DIAGNOSIS — L600 Ingrowing nail: Secondary | ICD-10-CM | POA: Diagnosis not present

## 2015-05-16 DIAGNOSIS — M79673 Pain in unspecified foot: Secondary | ICD-10-CM

## 2015-05-16 DIAGNOSIS — B351 Tinea unguium: Secondary | ICD-10-CM

## 2015-05-16 DIAGNOSIS — L03032 Cellulitis of left toe: Secondary | ICD-10-CM | POA: Diagnosis not present

## 2015-05-16 DIAGNOSIS — M79605 Pain in left leg: Secondary | ICD-10-CM

## 2015-05-16 NOTE — Progress Notes (Signed)
Painful ingrown nail left great toe medial border.  Having Chemotherapy for prostate cancer.  SUBJECTIVE: 80 y.o. year old male presents complaining of painful ingrown nail and wants it to be fixed. He has had ingrown nail surgery done on right great toe in past and has done well.  Patient is referred by Dr. Brigitte Pulse.   REVIEW OF SYSTEMS: A comprehensive review of systems was negative except for: going through Prostate cancer treatment.   OBJECTIVE: DERMATOLOGIC EXAMINATION: Nails: Thick yellow dystrophic nails x 10. Irregular jagged nail plate at distal 1/2 of right great toe nail with S/P nail surgery years back. Left great toe ingrown nail with inflamed ungual labia and pain.  VASCULAR EXAMINATION OF LOWER LIMBS: Pedal pulses: All pedal pulses are palpable with normal pulsation.  NEUROLOGIC EXAMINATION OF THE LOWER LIMBS: Frequent leg cramp. All epicritic and tactile sensations grossly intact.  MUSCULOSKELETAL EXAMINATION: No gross deformities noted.   ASSESSMENT: Infected ingrown nail left great toe. Mycotic nails x 10.   PLAN: Reviewed clinical findings and available treatment options. Patient wants the left great toe nail to be removed permanently. Discussed the benefit of removing only ingrown part of the nail at lateral border to minimize infection and complication. Patient understood the benefit.  Procedure done: Phenol and Alcohol matrixectomy left great toe lateral border. Affected left great toe was anesthetized with total 32ml mixture of 50/50 0.5% Marcaine plain and 1% Xylocaine plain. Affected lateral nail border was reflected with a nail elevator and excised with nail nipper. Proximal nail matrix tissue was cauterized with Phenol soaked cotton applicator x 4 and neutralized with Alcohol soaked cotton applicator. The wound was dressed with Amerigel ointment dressing. Home care instructions and supply dispensed.  Return in 1 week for follow up.

## 2015-05-16 NOTE — Patient Instructions (Signed)
Ingrown nail surgery was done on left great toe. Follow soaking instruction.  Some redness and drainage is expected. Call the office if the area gets feverish with increased redness and drainage.  

## 2015-05-23 ENCOUNTER — Encounter: Payer: Self-pay | Admitting: Podiatry

## 2015-05-23 ENCOUNTER — Ambulatory Visit (INDEPENDENT_AMBULATORY_CARE_PROVIDER_SITE_OTHER): Payer: Medicare Other | Admitting: Podiatry

## 2015-05-23 DIAGNOSIS — L600 Ingrowing nail: Secondary | ICD-10-CM

## 2015-05-23 NOTE — Progress Notes (Signed)
Ingrown nail surgery done one week ago on left great toe lateral border. Patient denies any discomfort. Been soaking daily.Wound is clean and dry. Continue to soak till redness subside. Return in 3 months for RFC.

## 2015-05-23 NOTE — Patient Instructions (Signed)
Post op nail wound healed well. Continue soaking if needed. Return in 3 month for Routine foot care.

## 2015-05-31 DIAGNOSIS — C61 Malignant neoplasm of prostate: Secondary | ICD-10-CM | POA: Diagnosis not present

## 2015-05-31 DIAGNOSIS — Z5111 Encounter for antineoplastic chemotherapy: Secondary | ICD-10-CM | POA: Diagnosis not present

## 2015-05-31 DIAGNOSIS — C772 Secondary and unspecified malignant neoplasm of intra-abdominal lymph nodes: Secondary | ICD-10-CM | POA: Diagnosis not present

## 2015-05-31 DIAGNOSIS — Z79899 Other long term (current) drug therapy: Secondary | ICD-10-CM | POA: Diagnosis not present

## 2015-05-31 DIAGNOSIS — C7951 Secondary malignant neoplasm of bone: Secondary | ICD-10-CM | POA: Diagnosis not present

## 2015-05-31 DIAGNOSIS — Z9079 Acquired absence of other genital organ(s): Secondary | ICD-10-CM | POA: Diagnosis not present

## 2015-05-31 DIAGNOSIS — Z923 Personal history of irradiation: Secondary | ICD-10-CM | POA: Diagnosis not present

## 2015-06-12 DIAGNOSIS — C61 Malignant neoplasm of prostate: Secondary | ICD-10-CM | POA: Diagnosis not present

## 2015-06-21 DIAGNOSIS — Z79899 Other long term (current) drug therapy: Secondary | ICD-10-CM | POA: Diagnosis not present

## 2015-06-21 DIAGNOSIS — C772 Secondary and unspecified malignant neoplasm of intra-abdominal lymph nodes: Secondary | ICD-10-CM | POA: Diagnosis not present

## 2015-06-21 DIAGNOSIS — Z7982 Long term (current) use of aspirin: Secondary | ICD-10-CM | POA: Diagnosis not present

## 2015-06-21 DIAGNOSIS — C7951 Secondary malignant neoplasm of bone: Secondary | ICD-10-CM | POA: Diagnosis not present

## 2015-06-21 DIAGNOSIS — Z5111 Encounter for antineoplastic chemotherapy: Secondary | ICD-10-CM | POA: Diagnosis not present

## 2015-06-21 DIAGNOSIS — C61 Malignant neoplasm of prostate: Secondary | ICD-10-CM | POA: Diagnosis not present

## 2015-06-28 DIAGNOSIS — L03113 Cellulitis of right upper limb: Secondary | ICD-10-CM | POA: Diagnosis not present

## 2015-06-28 DIAGNOSIS — Z6832 Body mass index (BMI) 32.0-32.9, adult: Secondary | ICD-10-CM | POA: Diagnosis not present

## 2015-06-28 DIAGNOSIS — M79602 Pain in left arm: Secondary | ICD-10-CM | POA: Diagnosis not present

## 2015-07-05 ENCOUNTER — Other Ambulatory Visit: Payer: Self-pay

## 2015-07-05 ENCOUNTER — Encounter: Payer: Self-pay | Admitting: Podiatry

## 2015-07-05 ENCOUNTER — Ambulatory Visit (INDEPENDENT_AMBULATORY_CARE_PROVIDER_SITE_OTHER): Payer: Medicare Other | Admitting: Podiatry

## 2015-07-05 VITALS — BP 107/65 | HR 81

## 2015-07-05 DIAGNOSIS — M216X1 Other acquired deformities of right foot: Secondary | ICD-10-CM

## 2015-07-05 DIAGNOSIS — M21969 Unspecified acquired deformity of unspecified lower leg: Secondary | ICD-10-CM

## 2015-07-05 DIAGNOSIS — M722 Plantar fascial fibromatosis: Secondary | ICD-10-CM | POA: Diagnosis not present

## 2015-07-05 NOTE — Patient Instructions (Addendum)
Seen for pain in right heel. Noted of tight Achilles tendon right. Reviewed stretch exercise. Do stretch exercise daily. Night Splint dispensed for right heel pain and tight tendon.  Cortisone injection given to right heel. Return in 2 weeks.

## 2015-07-05 NOTE — Progress Notes (Signed)
SUBJECTIVE: 80 y.o. year old male presents complaining of painful right heel pain x 3 weeks. Patient points plantar heel pad being the area of pain.  This is the first episode. Hurts after been on feet.   Stated that he has taken Chemotherapy and got infection in right arm a couple of months ago. Stated that he was treated with antibiotics before he got ingrown nail in April 2017. The ingrown nails was fixed and healed well without complication.   REVIEW OF SYSTEMS: A comprehensive review of systems was negative except for: going through Prostate cancer treatment.   OBJECTIVE: DERMATOLOGIC EXAMINATION: No abnormal skin lesions or nail problem.  VASCULAR EXAMINATION OF LOWER LIMBS: All pedal pulses are palpable with normal pulsation.  NEUROLOGIC EXAMINATION OF THE LOWER LIMBS: Frequent leg cramp. All epicritic and tactile sensations grossly intact.  MUSCULOSKELETAL EXAMINATION: Tight Achilles tendon on right lower limb. Elevated first ray with forefoot varus bilateral.   Radiographic examination of right foot reveal rectus foot with long first metatarsal in AP view. Lateral view reveal supinated foot with small growth of plantar calcaneal spur and elevated first metatarsal bone. Normal calcaneal inclination angle noted. No acute changes in osseous or articular surfaces noted.    ASSESSMENT: Plantar fasciitis right. Ankle equinus right. Forefoot varus with elevated first ray bilateral.   PLAN: Reviewed clinical findings and available treatment options.  Achilles tendon stretch exercise reviewed and advised to do daily at home. Night splint for right lower limb dispensed with instruction. As per request, right heel was injected with mixture of 4 mg Dexamethasone, 4 mg Triamcinolone, and 1 cc of 0.5% Marcaine plain. Patient tolerated well without difficulty.

## 2015-07-12 DIAGNOSIS — Z79899 Other long term (current) drug therapy: Secondary | ICD-10-CM | POA: Diagnosis not present

## 2015-07-12 DIAGNOSIS — Z923 Personal history of irradiation: Secondary | ICD-10-CM | POA: Diagnosis not present

## 2015-07-12 DIAGNOSIS — Z7952 Long term (current) use of systemic steroids: Secondary | ICD-10-CM | POA: Diagnosis not present

## 2015-07-12 DIAGNOSIS — Z7982 Long term (current) use of aspirin: Secondary | ICD-10-CM | POA: Diagnosis not present

## 2015-07-12 DIAGNOSIS — Z5181 Encounter for therapeutic drug level monitoring: Secondary | ICD-10-CM | POA: Diagnosis not present

## 2015-07-12 DIAGNOSIS — Z5111 Encounter for antineoplastic chemotherapy: Secondary | ICD-10-CM | POA: Diagnosis not present

## 2015-07-12 DIAGNOSIS — Z9079 Acquired absence of other genital organ(s): Secondary | ICD-10-CM | POA: Diagnosis not present

## 2015-07-12 DIAGNOSIS — C7951 Secondary malignant neoplasm of bone: Secondary | ICD-10-CM | POA: Diagnosis not present

## 2015-07-12 DIAGNOSIS — C61 Malignant neoplasm of prostate: Secondary | ICD-10-CM | POA: Diagnosis not present

## 2015-07-12 DIAGNOSIS — C772 Secondary and unspecified malignant neoplasm of intra-abdominal lymph nodes: Secondary | ICD-10-CM | POA: Diagnosis not present

## 2015-07-19 ENCOUNTER — Encounter: Payer: Self-pay | Admitting: Podiatry

## 2015-07-19 ENCOUNTER — Ambulatory Visit (INDEPENDENT_AMBULATORY_CARE_PROVIDER_SITE_OTHER): Payer: Medicare Other | Admitting: Podiatry

## 2015-07-19 VITALS — BP 104/64 | HR 92

## 2015-07-19 DIAGNOSIS — M79605 Pain in left leg: Secondary | ICD-10-CM | POA: Diagnosis not present

## 2015-07-19 DIAGNOSIS — M21969 Unspecified acquired deformity of unspecified lower leg: Secondary | ICD-10-CM | POA: Diagnosis not present

## 2015-07-19 DIAGNOSIS — M722 Plantar fascial fibromatosis: Secondary | ICD-10-CM

## 2015-07-19 NOTE — Patient Instructions (Signed)
2nd injection given to right heel as per request. Return as needed.

## 2015-07-19 NOTE — Progress Notes (Signed)
SUBJECTIVE: 80 y.o. year old male presents stating the right heel pain is getting better but wants to have another injection.  Last injection relieved pain about 80%. Also using Night Splint.  Past history includes prostatic cancer treatment with Chemotherapy 2017. The ingrown nails was fixed and healed well without complication.   OBJECTIVE: DERMATOLOGIC EXAMINATION: No abnormal skin lesions or nail problem.  VASCULAR EXAMINATION OF LOWER LIMBS: All pedal pulses are palpable with normal pulsation.  NEUROLOGIC EXAMINATION OF THE LOWER LIMBS: Frequent leg cramp. All epicritic and tactile sensations grossly intact.  MUSCULOSKELETAL EXAMINATION: Tight Achilles tendon on right lower limb. Elevated first ray with forefoot varus bilateral.   ASSESSMENT: Plantar fasciitis right. Ankle equinus right. Forefoot varus with elevated first ray bilateral.   PLAN: Reviewed clinical findings and available treatment options.  As per request, right heel was injected with mixture of 4 mg Dexamethasone, 4 mg Triamcinolone, and 1 cc of 0.5% Marcaine plain. Patient tolerated well without difficulty

## 2015-08-02 DIAGNOSIS — C61 Malignant neoplasm of prostate: Secondary | ICD-10-CM | POA: Diagnosis not present

## 2015-08-02 DIAGNOSIS — C772 Secondary and unspecified malignant neoplasm of intra-abdominal lymph nodes: Secondary | ICD-10-CM | POA: Diagnosis not present

## 2015-08-02 DIAGNOSIS — Z79899 Other long term (current) drug therapy: Secondary | ICD-10-CM | POA: Diagnosis not present

## 2015-08-02 DIAGNOSIS — Z5111 Encounter for antineoplastic chemotherapy: Secondary | ICD-10-CM | POA: Diagnosis not present

## 2015-08-02 DIAGNOSIS — Z7982 Long term (current) use of aspirin: Secondary | ICD-10-CM | POA: Diagnosis not present

## 2015-08-07 DIAGNOSIS — H11823 Conjunctivochalasis, bilateral: Secondary | ICD-10-CM | POA: Diagnosis not present

## 2015-08-07 DIAGNOSIS — Z961 Presence of intraocular lens: Secondary | ICD-10-CM | POA: Diagnosis not present

## 2015-08-07 DIAGNOSIS — H01024 Squamous blepharitis left upper eyelid: Secondary | ICD-10-CM | POA: Diagnosis not present

## 2015-08-07 DIAGNOSIS — H02834 Dermatochalasis of left upper eyelid: Secondary | ICD-10-CM | POA: Diagnosis not present

## 2015-08-07 DIAGNOSIS — H02831 Dermatochalasis of right upper eyelid: Secondary | ICD-10-CM | POA: Diagnosis not present

## 2015-08-07 DIAGNOSIS — H01022 Squamous blepharitis right lower eyelid: Secondary | ICD-10-CM | POA: Diagnosis not present

## 2015-08-07 DIAGNOSIS — H01025 Squamous blepharitis left lower eyelid: Secondary | ICD-10-CM | POA: Diagnosis not present

## 2015-08-07 DIAGNOSIS — H25813 Combined forms of age-related cataract, bilateral: Secondary | ICD-10-CM | POA: Diagnosis not present

## 2015-08-07 DIAGNOSIS — D3132 Benign neoplasm of left choroid: Secondary | ICD-10-CM | POA: Diagnosis not present

## 2015-08-07 DIAGNOSIS — H01021 Squamous blepharitis right upper eyelid: Secondary | ICD-10-CM | POA: Diagnosis not present

## 2015-08-07 DIAGNOSIS — H43812 Vitreous degeneration, left eye: Secondary | ICD-10-CM | POA: Diagnosis not present

## 2015-08-10 DIAGNOSIS — E784 Other hyperlipidemia: Secondary | ICD-10-CM | POA: Diagnosis not present

## 2015-08-10 DIAGNOSIS — I10 Essential (primary) hypertension: Secondary | ICD-10-CM | POA: Diagnosis not present

## 2015-08-17 DIAGNOSIS — Z0000 Encounter for general adult medical examination without abnormal findings: Secondary | ICD-10-CM | POA: Diagnosis not present

## 2015-08-17 DIAGNOSIS — I10 Essential (primary) hypertension: Secondary | ICD-10-CM | POA: Diagnosis not present

## 2015-08-17 DIAGNOSIS — E784 Other hyperlipidemia: Secondary | ICD-10-CM | POA: Diagnosis not present

## 2015-08-17 DIAGNOSIS — C61 Malignant neoplasm of prostate: Secondary | ICD-10-CM | POA: Diagnosis not present

## 2015-08-17 DIAGNOSIS — Z6833 Body mass index (BMI) 33.0-33.9, adult: Secondary | ICD-10-CM | POA: Diagnosis not present

## 2015-08-17 DIAGNOSIS — K5909 Other constipation: Secondary | ICD-10-CM | POA: Diagnosis not present

## 2015-08-17 DIAGNOSIS — M859 Disorder of bone density and structure, unspecified: Secondary | ICD-10-CM | POA: Diagnosis not present

## 2015-08-17 DIAGNOSIS — Z1389 Encounter for screening for other disorder: Secondary | ICD-10-CM | POA: Diagnosis not present

## 2015-08-17 DIAGNOSIS — E291 Testicular hypofunction: Secondary | ICD-10-CM | POA: Diagnosis not present

## 2015-08-17 DIAGNOSIS — E669 Obesity, unspecified: Secondary | ICD-10-CM | POA: Diagnosis not present

## 2015-08-22 ENCOUNTER — Encounter: Payer: Self-pay | Admitting: Podiatry

## 2015-08-22 ENCOUNTER — Ambulatory Visit (INDEPENDENT_AMBULATORY_CARE_PROVIDER_SITE_OTHER): Payer: Medicare Other | Admitting: Podiatry

## 2015-08-22 VITALS — BP 131/83 | HR 79

## 2015-08-22 DIAGNOSIS — L600 Ingrowing nail: Secondary | ICD-10-CM

## 2015-08-22 DIAGNOSIS — M79605 Pain in left leg: Secondary | ICD-10-CM

## 2015-08-22 DIAGNOSIS — B351 Tinea unguium: Secondary | ICD-10-CM | POA: Diagnosis not present

## 2015-08-22 NOTE — Progress Notes (Signed)
SUBJECTIVE: 80 y.o. year old male presents requesting toe nails trimmed. Having no more problem with heel pain.  Still getting Chemotherapy.  Past history includes prostatic cancer treatment with Chemotherapy 2017. The ingrown nails was fixed and healed well without complication.   OBJECTIVE: DERMATOLOGIC EXAMINATION: Thick dystrophic nails x 10, pain with ambulation. VASCULAR EXAMINATION OF LOWER LIMBS: All pedal pulses are palpable with normal pulsation.  NEUROLOGIC EXAMINATION OF THE LOWER LIMBS: Frequent leg cramp. All epicritic and tactile sensations grossly intact.  MUSCULOSKELETAL EXAMINATION: Tight Achilles tendon on right lower limb. Elevated first ray with forefoot varus bilateral.   ASSESSMENT: Onychomycosis x 10. Pain in lower limbs.  PLAN: Reviewed clinical findings and available treatment options.  All nails debrided. Return in 3 months or as needed.

## 2015-08-22 NOTE — Patient Instructions (Signed)
Seen for hypertrophic nails. All nails debrided. Return in 3 months or as needed.  

## 2015-08-23 DIAGNOSIS — C61 Malignant neoplasm of prostate: Secondary | ICD-10-CM | POA: Diagnosis not present

## 2015-08-23 DIAGNOSIS — Z5111 Encounter for antineoplastic chemotherapy: Secondary | ICD-10-CM | POA: Diagnosis not present

## 2015-08-23 DIAGNOSIS — C7951 Secondary malignant neoplasm of bone: Secondary | ICD-10-CM | POA: Diagnosis not present

## 2015-08-23 DIAGNOSIS — Z7952 Long term (current) use of systemic steroids: Secondary | ICD-10-CM | POA: Diagnosis not present

## 2015-08-23 DIAGNOSIS — Z7982 Long term (current) use of aspirin: Secondary | ICD-10-CM | POA: Diagnosis not present

## 2015-08-23 DIAGNOSIS — Z9079 Acquired absence of other genital organ(s): Secondary | ICD-10-CM | POA: Diagnosis not present

## 2015-08-23 DIAGNOSIS — C772 Secondary and unspecified malignant neoplasm of intra-abdominal lymph nodes: Secondary | ICD-10-CM | POA: Diagnosis not present

## 2015-08-23 DIAGNOSIS — Z79899 Other long term (current) drug therapy: Secondary | ICD-10-CM | POA: Diagnosis not present

## 2015-08-23 DIAGNOSIS — Z923 Personal history of irradiation: Secondary | ICD-10-CM | POA: Diagnosis not present

## 2015-09-13 DIAGNOSIS — Z9079 Acquired absence of other genital organ(s): Secondary | ICD-10-CM | POA: Diagnosis not present

## 2015-09-13 DIAGNOSIS — C7951 Secondary malignant neoplasm of bone: Secondary | ICD-10-CM | POA: Diagnosis not present

## 2015-09-13 DIAGNOSIS — Z5111 Encounter for antineoplastic chemotherapy: Secondary | ICD-10-CM | POA: Diagnosis not present

## 2015-09-13 DIAGNOSIS — Z7982 Long term (current) use of aspirin: Secondary | ICD-10-CM | POA: Diagnosis not present

## 2015-09-13 DIAGNOSIS — C772 Secondary and unspecified malignant neoplasm of intra-abdominal lymph nodes: Secondary | ICD-10-CM | POA: Diagnosis not present

## 2015-09-13 DIAGNOSIS — Z923 Personal history of irradiation: Secondary | ICD-10-CM | POA: Diagnosis not present

## 2015-09-13 DIAGNOSIS — Z79899 Other long term (current) drug therapy: Secondary | ICD-10-CM | POA: Diagnosis not present

## 2015-09-13 DIAGNOSIS — C61 Malignant neoplasm of prostate: Secondary | ICD-10-CM | POA: Diagnosis not present

## 2015-09-13 DIAGNOSIS — Z9221 Personal history of antineoplastic chemotherapy: Secondary | ICD-10-CM | POA: Diagnosis not present

## 2015-10-04 DIAGNOSIS — Z5111 Encounter for antineoplastic chemotherapy: Secondary | ICD-10-CM | POA: Diagnosis not present

## 2015-10-04 DIAGNOSIS — C7951 Secondary malignant neoplasm of bone: Secondary | ICD-10-CM | POA: Diagnosis not present

## 2015-10-04 DIAGNOSIS — Z79899 Other long term (current) drug therapy: Secondary | ICD-10-CM | POA: Diagnosis not present

## 2015-10-04 DIAGNOSIS — Z9079 Acquired absence of other genital organ(s): Secondary | ICD-10-CM | POA: Diagnosis not present

## 2015-10-04 DIAGNOSIS — C61 Malignant neoplasm of prostate: Secondary | ICD-10-CM | POA: Diagnosis not present

## 2015-10-04 DIAGNOSIS — C772 Secondary and unspecified malignant neoplasm of intra-abdominal lymph nodes: Secondary | ICD-10-CM | POA: Diagnosis not present

## 2015-10-04 DIAGNOSIS — K5900 Constipation, unspecified: Secondary | ICD-10-CM | POA: Diagnosis not present

## 2015-11-08 ENCOUNTER — Encounter: Payer: Self-pay | Admitting: Podiatry

## 2015-11-08 ENCOUNTER — Ambulatory Visit (INDEPENDENT_AMBULATORY_CARE_PROVIDER_SITE_OTHER): Payer: Medicare Other | Admitting: Podiatry

## 2015-11-08 DIAGNOSIS — M79673 Pain in unspecified foot: Secondary | ICD-10-CM

## 2015-11-08 DIAGNOSIS — B351 Tinea unguium: Secondary | ICD-10-CM | POA: Diagnosis not present

## 2015-11-08 DIAGNOSIS — L539 Erythematous condition, unspecified: Secondary | ICD-10-CM

## 2015-11-08 DIAGNOSIS — M79605 Pain in left leg: Secondary | ICD-10-CM

## 2015-11-08 DIAGNOSIS — R600 Localized edema: Secondary | ICD-10-CM

## 2015-11-08 NOTE — Progress Notes (Signed)
SUBJECTIVE: 80 y.o. year old male presents with red swollen foot. Stated that he noted painful swollen foot with redness on both feet 2 days ago and last night was worse. Now they have gone down. Redness has subsided on right and much less redness on left.  He could not wait another 2 weeks till his next appointment and came in today.  He goes to [facility] daily for stretch exercise, treadmill, and for other exercises.  HPI: Hx of Steroid treatment since February 2017 as part of cancer treatment.  He finished Chemotherapy for Prostate cancer since he was here last in August 22, 2015.  In April 2017 had uneventful recovery following ingrown nail surgery. In May 2017 treated with cortisone injection for Plantar fasciitis on right, stretch exercise for tight Achilles tendon on right.  OBJECTIVE: DERMATOLOGIC EXAMINATION: Thick dystrophic nails x 10, pain with ambulation. No open skin lesions on both feet. VASCULAR EXAMINATION OF LOWER LIMBS: Mild erythema, superficial over lesser metatarsal shaft area left foot without skin damage. Positive for bilateral lower limb 2++ pitting edema involving foot, ankle and lower limbs,  All pedal pulses are palpable with normal pulsation.  NEUROLOGIC EXAMINATION OF THE LOWER LIMBS: Subjective frequent leg cramp. All epicritic and tactile sensations grossly intact.  MUSCULOSKELETAL EXAMINATION: No change in tight Achilles tendon on right lower limb. Elevated first ray with forefoot varus bilateral.   ASSESSMENT: Bilateral lower limb edema possible due to prolonged corticosteroid treatment.  Possible shoe irritation on dorsum of swollen foot during exercise causing transient erythema.  Onychomycosis x 10. Pain in lower limbs.  PLAN: Reviewed clinical findings and available treatment options; continue exercise, use compression socks during the day to control edema, ok to use OTC compression socks, use well supported shoes and socks. Continue with stretch  exercise for tight Achilles tendon. May use skin cream needed for irritated skin. All nails debrided. Return in 3 months or sooner if needed.

## 2015-11-08 NOTE — Patient Instructions (Signed)
Seen for red swollen foot. Possible swelling from Steroid treatment and shoe irritation on top of both feet causing redness. All nails debrided. May benefit from compression socks to reduce swelling. Return in 3 month or sooner if needed.

## 2015-11-14 ENCOUNTER — Observation Stay (HOSPITAL_COMMUNITY)
Admission: EM | Admit: 2015-11-14 | Discharge: 2015-11-15 | Disposition: A | Discharge status: Home / Self Care | Payer: Medicare Other | Attending: Internal Medicine | Admitting: Internal Medicine

## 2015-11-14 ENCOUNTER — Emergency Department (HOSPITAL_COMMUNITY): Payer: Medicare Other

## 2015-11-14 ENCOUNTER — Encounter (HOSPITAL_COMMUNITY): Payer: Self-pay | Admitting: Radiology

## 2015-11-14 ENCOUNTER — Observation Stay (HOSPITAL_BASED_OUTPATIENT_CLINIC_OR_DEPARTMENT_OTHER): Payer: Medicare Other

## 2015-11-14 DIAGNOSIS — D696 Thrombocytopenia, unspecified: Secondary | ICD-10-CM | POA: Diagnosis present

## 2015-11-14 DIAGNOSIS — Z7982 Long term (current) use of aspirin: Secondary | ICD-10-CM | POA: Diagnosis not present

## 2015-11-14 DIAGNOSIS — K5900 Constipation, unspecified: Secondary | ICD-10-CM | POA: Insufficient documentation

## 2015-11-14 DIAGNOSIS — I2699 Other pulmonary embolism without acute cor pulmonale: Secondary | ICD-10-CM | POA: Diagnosis not present

## 2015-11-14 DIAGNOSIS — C61 Malignant neoplasm of prostate: Secondary | ICD-10-CM | POA: Insufficient documentation

## 2015-11-14 DIAGNOSIS — Z9221 Personal history of antineoplastic chemotherapy: Secondary | ICD-10-CM | POA: Insufficient documentation

## 2015-11-14 DIAGNOSIS — R0781 Pleurodynia: Secondary | ICD-10-CM | POA: Diagnosis present

## 2015-11-14 DIAGNOSIS — R1011 Right upper quadrant pain: Secondary | ICD-10-CM | POA: Diagnosis not present

## 2015-11-14 DIAGNOSIS — K760 Fatty (change of) liver, not elsewhere classified: Secondary | ICD-10-CM | POA: Insufficient documentation

## 2015-11-14 DIAGNOSIS — Z8546 Personal history of malignant neoplasm of prostate: Secondary | ICD-10-CM | POA: Diagnosis not present

## 2015-11-14 HISTORY — DX: Malignant neoplasm of prostate: C61

## 2015-11-14 LAB — CBC WITH DIFFERENTIAL/PLATELET
BASOS ABS: 0 10*3/uL (ref 0.0–0.1)
BASOS PCT: 0 %
Eosinophils Absolute: 0 10*3/uL (ref 0.0–0.7)
Eosinophils Relative: 0 %
HEMATOCRIT: 36.5 % — AB (ref 39.0–52.0)
Hemoglobin: 11.7 g/dL — ABNORMAL LOW (ref 13.0–17.0)
LYMPHS PCT: 3 %
Lymphs Abs: 0.3 10*3/uL — ABNORMAL LOW (ref 0.7–4.0)
MCH: 30.6 pg (ref 26.0–34.0)
MCHC: 32.1 g/dL (ref 30.0–36.0)
MCV: 95.5 fL (ref 78.0–100.0)
MONO ABS: 1.1 10*3/uL — AB (ref 0.1–1.0)
Monocytes Relative: 11 %
NEUTROS ABS: 8.3 10*3/uL — AB (ref 1.7–7.7)
Neutrophils Relative %: 86 %
PLATELETS: 125 10*3/uL — AB (ref 150–400)
RBC: 3.82 MIL/uL — AB (ref 4.22–5.81)
RDW: 14.9 % (ref 11.5–15.5)
WBC: 9.7 10*3/uL (ref 4.0–10.5)

## 2015-11-14 LAB — URINALYSIS, ROUTINE W REFLEX MICROSCOPIC
Bilirubin Urine: NEGATIVE
Glucose, UA: NEGATIVE mg/dL
Hgb urine dipstick: NEGATIVE
KETONES UR: NEGATIVE mg/dL
LEUKOCYTES UA: NEGATIVE
NITRITE: NEGATIVE
PH: 7.5 (ref 5.0–8.0)
PROTEIN: NEGATIVE mg/dL
Specific Gravity, Urine: 1.018 (ref 1.005–1.030)

## 2015-11-14 LAB — COMPREHENSIVE METABOLIC PANEL
ALT: 12 U/L — ABNORMAL LOW (ref 17–63)
ANION GAP: 6 (ref 5–15)
AST: 15 U/L (ref 15–41)
Albumin: 3.8 g/dL (ref 3.5–5.0)
Alkaline Phosphatase: 38 U/L (ref 38–126)
BILIRUBIN TOTAL: 1.1 mg/dL (ref 0.3–1.2)
BUN: 21 mg/dL — AB (ref 6–20)
CHLORIDE: 105 mmol/L (ref 101–111)
CO2: 26 mmol/L (ref 22–32)
Calcium: 8.8 mg/dL — ABNORMAL LOW (ref 8.9–10.3)
Creatinine, Ser: 0.94 mg/dL (ref 0.61–1.24)
Glucose, Bld: 123 mg/dL — ABNORMAL HIGH (ref 65–99)
POTASSIUM: 4.4 mmol/L (ref 3.5–5.1)
Sodium: 137 mmol/L (ref 135–145)
TOTAL PROTEIN: 6.1 g/dL — AB (ref 6.5–8.1)

## 2015-11-14 LAB — TROPONIN I

## 2015-11-14 LAB — HEPARIN LEVEL (UNFRACTIONATED): Heparin Unfractionated: 0.31 [IU]/mL (ref 0.30–0.70)

## 2015-11-14 LAB — LIPASE, BLOOD: LIPASE: 16 U/L (ref 11–51)

## 2015-11-14 LAB — I-STAT TROPONIN, ED: TROPONIN I, POC: 0 ng/mL (ref 0.00–0.08)

## 2015-11-14 LAB — I-STAT CG4 LACTIC ACID, ED: Lactic Acid, Venous: 1.42 mmol/L (ref 0.5–1.9)

## 2015-11-14 MED ORDER — SODIUM CHLORIDE 0.9% FLUSH
3.0000 mL | Freq: Two times a day (BID) | INTRAVENOUS | Status: DC
  Administered 2015-11-14: 3 mL via INTRAVENOUS

## 2015-11-14 MED ORDER — FENTANYL CITRATE (PF) 100 MCG/2ML IJ SOLN
50.0000 ug | Freq: Once | INTRAMUSCULAR | Status: AC
Start: 2015-11-14 — End: 2015-11-14
  Administered 2015-11-14: 50 ug via INTRAVENOUS
  Filled 2015-11-14: qty 2

## 2015-11-14 MED ORDER — ACETAMINOPHEN 650 MG RE SUPP: 650.0000 mg | Freq: Four times a day (QID) | RECTAL | Status: DC | PRN

## 2015-11-14 MED ORDER — HYDROCODONE-ACETAMINOPHEN 5-325 MG PO TABS
1.0000 | ORAL_TABLET | ORAL | Status: DC | PRN
  Administered 2015-11-14: 1 via ORAL
  Administered 2015-11-14 – 2015-11-15 (×2): 2 via ORAL
  Filled 2015-11-14: qty 1
  Filled 2015-11-14 (×2): qty 2

## 2015-11-14 MED ORDER — MORPHINE SULFATE (PF) 2 MG/ML IV SOLN: 2.0000 mg | INTRAVENOUS | Status: DC | PRN

## 2015-11-14 MED ORDER — ONDANSETRON HCL 4 MG PO TABS
4.0000 mg | ORAL_TABLET | Freq: Four times a day (QID) | ORAL | Status: DC | PRN
Start: 2015-11-14 — End: 2015-11-15
  Administered 2015-11-15: 4 mg via ORAL
  Filled 2015-11-14: qty 1

## 2015-11-14 MED ORDER — SODIUM CHLORIDE 0.9 % IV BOLUS (SEPSIS)
1000.0000 mL | Freq: Once | INTRAVENOUS | Status: AC
  Administered 2015-11-14: 1000 mL via INTRAVENOUS

## 2015-11-14 MED ORDER — IOPAMIDOL (ISOVUE-370) INJECTION 76%
100.0000 mL | Freq: Once | INTRAVENOUS | Status: AC | PRN
  Administered 2015-11-14: 100 mL via INTRAVENOUS

## 2015-11-14 MED ORDER — BISACODYL 10 MG RE SUPP: 10.0000 mg | Freq: Every day | RECTAL | Status: DC | PRN

## 2015-11-14 MED ORDER — ONDANSETRON HCL 4 MG/2ML IJ SOLN: 4.0000 mg | Freq: Four times a day (QID) | INTRAMUSCULAR | Status: DC | PRN

## 2015-11-14 MED ORDER — ACETAMINOPHEN 325 MG PO TABS: 650.0000 mg | ORAL_TABLET | Freq: Four times a day (QID) | ORAL | Status: DC | PRN

## 2015-11-14 MED ORDER — HEPARIN (PORCINE) IN NACL 100-0.45 UNIT/ML-% IJ SOLN
1700.0000 [IU]/h | INTRAMUSCULAR | Status: DC
  Administered 2015-11-14: 1600 [IU]/h via INTRAVENOUS
  Administered 2015-11-15: 1700 [IU]/h via INTRAVENOUS
  Filled 2015-11-14 (×2): qty 250

## 2015-11-14 MED ORDER — SENNOSIDES-DOCUSATE SODIUM 8.6-50 MG PO TABS
1.0000 | ORAL_TABLET | Freq: Every evening | ORAL | Status: DC | PRN
Start: 2015-11-14 — End: 2015-11-15

## 2015-11-14 MED ORDER — FENTANYL CITRATE (PF) 100 MCG/2ML IJ SOLN
50.0000 ug | Freq: Once | INTRAMUSCULAR | Status: AC
  Administered 2015-11-14: 50 ug via INTRAVENOUS
  Filled 2015-11-14: qty 2

## 2015-11-14 MED ORDER — HEPARIN BOLUS VIA INFUSION
3000.0000 [IU] | Freq: Once | INTRAVENOUS | Status: AC
  Administered 2015-11-14: 3000 [IU] via INTRAVENOUS
  Filled 2015-11-14: qty 3000

## 2015-11-14 MED ORDER — HYPROMELLOSE (GONIOSCOPIC) 2.5 % OP SOLN: 1.0000 [drp] | Freq: Three times a day (TID) | OPHTHALMIC | Status: DC | PRN

## 2015-11-14 MED ORDER — POLYVINYL ALCOHOL 1.4 % OP SOLN
1.0000 [drp] | Freq: Three times a day (TID) | OPHTHALMIC | Status: DC | PRN
  Filled 2015-11-14: qty 15

## 2015-11-14 NOTE — ED Triage Notes (Signed)
Per EMS pt c/o sharp RUQ pain onset last night at 2000 while sitting. Alleviated when laying in certain positions. Worsened with deep inspiration. Hx prostate cancer, no recent chemo.

## 2015-11-14 NOTE — H&P (Signed)
History and Physical  Nicolas Woodward [id number] DOB: [birth year] DOA: 11/14/2015  Referring physician: Regenia Skeeter PCP: Marton Redwood, MD   Chief Complaint: ruq pain  HPI: Nicolas Woodward is a 80 y.o. male with a history of prostate cancer recently treated with chemotherapy presents with right upper quadrant pain since last night. States it started around 8 PM while he was sitting in a chair and has been constant since. Certain movements or positions improve the pain. He has not had anything to eat or drink since it started. No nausea, vomiting, chest pain, or shortness of breath. However deep inspiration worsens the pain. No radiation of the pain or back pain. Chronic constipation but no worsening or new diarrhea. Denies fevers. Has not taken anything for the pain. Prior history of lower abdominal surgery for cancer but denies any other abdominal surgeries or cholecystectomy. No dizziness. He was noted in ED to have a pulmonary embolus.  He was started on IV heparin for anticoagulation and admission was requested.    Review of Systems:  Review of Systems  Constitutional: Negative for fever.  Respiratory: Negative for cough and shortness of breath.   Cardiovascular: Negative for chest pain.  Gastrointestinal: Positive for abdominal pain and constipation.  Negative for diarrhea, nausea and vomiting.  Genitourinary: Negative for dysuria.  Musculoskeletal: Negative for back pain.  All other systems reviewed and are negative.  Past Medical History:  Diagnosis Date  . Prostate cancer [facility])    History reviewed. No pertinent surgical history. Social History:  reports that he has never smoked. He has never used smokeless tobacco. His alcohol and drug histories are not on file.   No Known Allergies  History reviewed. No pertinent family history.   Prior to Admission medications   Medication Sig Start Date End Date Taking? Authorizing Provider  aspirin 325 MG tablet Take 325 mg by mouth  at bedtime.   Yes Historical Provider, MD  cholecalciferol (VITAMIN D) 1000 units tablet Take 1,000 Units by mouth at bedtime.   Yes Historical Provider, MD  hydroxypropyl methylcellulose / hypromellose (ISOPTO TEARS / GONIOVISC) 2.5 % ophthalmic solution Place 1 drop into both eyes 3 (three) times daily as needed for dry eyes.   Yes Historical Provider, MD  Linaclotide Rolan Lipa) 145 MCG CAPS capsule Take 145 mcg by mouth daily before breakfast. As needed for constipation 01/04/15  Yes Historical Provider, MD   Physical Exam: Vitals:   11/14/15 1300 11/14/15 1330 11/14/15 1400 11/14/15 1404  BP: 140/81 135/74 124/72   Pulse: 88 85 84   Resp: 19 22 20    Temp:      TempSrc:      SpO2: 96% 93% 94%   Weight:    108.9 kg (240 lb)  Height:    6' (1.829 m)    General exam: Moderately built and nourished patient, lying comfortably supine on the gurney in no obvious distress.  Head, eyes and ENT: Nontraumatic and normocephalic. Pupils equally reacting to light and accommodation. Oral mucosa moist.  Neck: Supple. No JVD, carotid bruit or thyromegaly.  Lymphatics: No lymphadenopathy.  Respiratory system: Clear to auscultation. No increased work of breathing.  Cardiovascular system: S1 and S2 heard, mild tachycardia. No JVD, murmurs, gallops, clicks or pedal edema.  Gastrointestinal system: Abdomen is nondistended, soft and mild RUQ TTP. Normal bowel sounds heard. No organomegaly or masses appreciated.  Central nervous system: Alert and oriented. No focal neurological deficits.  Extremities: Symmetric 5 x 5 power. Peripheral pulses symmetrically  felt.   Skin: No rashes or acute findings.  Musculoskeletal system: Negative exam.  Psychiatry: Pleasant and cooperative.   Labs on Admission:  Basic Metabolic Panel:  Recent Labs Lab 11/14/15 0943  NA 137  K 4.4  CL 105  CO2 26  GLUCOSE 123*  BUN 21*  CREATININE 0.94  CALCIUM 8.8*   Liver Function Tests:  Recent Labs Lab  11/14/15 0943  AST 15  ALT 12*  ALKPHOS 38  BILITOT 1.1  PROT 6.1*  ALBUMIN 3.8    Recent Labs Lab 11/14/15 0943  LIPASE 16   No results for input(s): AMMONIA in the last 168 hours. CBC:  Recent Labs Lab 11/14/15 0943  WBC 9.7  NEUTROABS 8.3*  HGB 11.7*  HCT 36.5*  MCV 95.5  PLT 125*   Cardiac Enzymes: No results for input(s): CKTOTAL, CKMB, CKMBINDEX, TROPONINI in the last 168 hours.  BNP (last 3 results) No results for input(s): PROBNP in the last 8760 hours. CBG: No results for input(s): GLUCAP in the last 168 hours.  Radiological Exams on Admission: Ct Angio Chest Pe W/cm &/or Wo Cm  Result Date: 11/14/2015 CLINICAL DATA:  80 year old male with history of metastatic prostate cancer originally diagnosed in 1994. Complaining of sharp right upper quadrant abdominal pain since 8 p.m. yesterday evening. Pain worsens with deep inspiration. EXAM: CT ANGIOGRAPHY CHEST CT ABDOMEN AND PELVIS WITH CONTRAST TECHNIQUE: Multidetector CT imaging of the chest was performed using the standard protocol during bolus administration of intravenous contrast. Multiplanar CT image reconstructions and MIPs were obtained to evaluate the vascular anatomy. Multidetector CT imaging of the abdomen and pelvis was performed using the standard protocol during bolus administration of intravenous contrast. CONTRAST:  100 mL of Isovue 370. COMPARISON:  No priors. FINDINGS: CTA CHEST FINDINGS Cardiovascular: There is a small nonobstructive filling defect within the subsegmental sized pulmonary artery branches to the right lower lobe (best appreciated on images 187-197 of series 7). No other central, lobar or segmental sized filling defects are noted to suggest larger pulmonary emboli. Heart size is normal. There is no significant pericardial fluid, thickening or pericardial calcification. There is aortic atherosclerosis, as well as atherosclerosis of the great vessels of the mediastinum and the coronary  arteries, including calcified atherosclerotic plaque in the left main, left anterior descending and right coronary arteries. Mediastinum/Nodes: No pathologically enlarged mediastinal or hilar lymph nodes. Esophagus is unremarkable in appearance. Lungs/Pleura: Patchy areas of ground-glass attenuation and interlobular septal thickening are noted the lung bases bilaterally, particularly in the lower lobes, which may suggest sequela of recent mild aspiration. No confluent consolidative airspace disease. Trace right pleural effusion with some mild right pleural thickening. Probable area of subsegmental atelectasis in the medial aspect of the lingula. Musculoskeletal: There are no aggressive appearing lytic or blastic lesions noted in the visualized portions of the skeleton. Review of the MIP images confirms the above findings. CT ABDOMEN and PELVIS FINDINGS Hepatobiliary: Sub cm low-attenuation lesion in segment 2 of the liver, too small to characterize, but likely a cyst. No other suspicious hepatic lesions are noted. No intra or extrahepatic biliary ductal dilatation. Gallbladder is nearly decompressed, but otherwise unremarkable in appearance. Pancreas: No pancreatic mass. No pancreatic ductal dilatation. No pancreatic or peripancreatic fluid or inflammatory changes. Spleen: Unremarkable. Adrenals/Urinary Tract: 2.8 cm lesion at the junction of upper and interpolar regions of the right kidney is compatible with a simple cyst exophytic simple cyst measuring 1.5 cm in the interpolar region of the left kidney. Several other  sub cm low-attenuation lesions in the kidneys bilaterally too small to definitively characterize, but are also favored to represent tiny cysts. No hydroureteronephrosis. Urinary bladder is normal in appearance. Right adrenal gland is normal in appearance. Mild adreniform thickening of the left adrenal gland, likely related to mild left adrenal hyperplasia. Stomach/Bowel: The appearance of the stomach  is normal. There is no pathologic dilatation of small bowel or colon. Numerous colonic diverticulae are noted, without surrounding inflammatory changes to suggest an acute diverticulitis at this time. Vascular/Lymphatic: Aortic atherosclerosis, without evidence of aneurysm or dissection in the abdominal or pelvic vasculature. No lymphadenopathy noted in the abdomen or pelvis. Reproductive: Status post radical prostatectomy. Surgical clips along the pelvic side wall from prior lymph node dissection. Other: No significant volume of ascites.  No pneumoperitoneum. Musculoskeletal: Bilateral pars defects at L4 with 17 mm of anterolisthesis of L4 upon L5. Chronic appearing compression fracture of T11 with approximately 50% loss of anterior vertebral body height. There are no aggressive appearing lytic or blastic lesions noted in the visualized portions of the skeleton. Review of the MIP images confirms the above findings. IMPRESSION: 1. Small subsegmental sized pulmonary embolism in the right lower lobe. This is of uncertain clinical significance. No larger central, lobar or segmental sized embolus is noted. 2. There are patchy areas of ground-glass attenuation and septal thickening throughout the lung bases bilaterally, suggesting sequela of recent aspiration. 3. Aortic atherosclerosis, in addition to left main and 2 vessel coronary artery disease. Please note that although the presence of coronary artery calcium documents the presence of coronary artery disease, the severity of this disease and any potential stenosis cannot be assessed on this non-gated CT examination. Assessment for potential risk factor modification, dietary therapy or pharmacologic therapy may be warranted, if clinically indicated. 4. Postoperative changes of radical prostatectomy and pelvic lymph node dissection, without findings to suggest local recurrence of disease or definite metastatic disease. 5. Grade 2 spondylolisthesis of L4 upon L5. 6.  Additional incidental findings, as above. Electronically Signed   By: Vinnie Langton M.D.   On: 11/14/2015 13:18   Ct Abdomen Pelvis W Contrast  Result Date: 11/14/2015 CLINICAL DATA:  80 year old male with history of metastatic prostate cancer originally diagnosed in 1994. Complaining of sharp right upper quadrant abdominal pain since 8 p.m. yesterday evening. Pain worsens with deep inspiration. EXAM: CT ANGIOGRAPHY CHEST CT ABDOMEN AND PELVIS WITH CONTRAST TECHNIQUE: Multidetector CT imaging of the chest was performed using the standard protocol during bolus administration of intravenous contrast. Multiplanar CT image reconstructions and MIPs were obtained to evaluate the vascular anatomy. Multidetector CT imaging of the abdomen and pelvis was performed using the standard protocol during bolus administration of intravenous contrast. CONTRAST:  100 mL of Isovue 370. COMPARISON:  No priors. FINDINGS: CTA CHEST FINDINGS Cardiovascular: There is a small nonobstructive filling defect within the subsegmental sized pulmonary artery branches to the right lower lobe (best appreciated on images 187-197 of series 7). No other central, lobar or segmental sized filling defects are noted to suggest larger pulmonary emboli. Heart size is normal. There is no significant pericardial fluid, thickening or pericardial calcification. There is aortic atherosclerosis, as well as atherosclerosis of the great vessels of the mediastinum and the coronary arteries, including calcified atherosclerotic plaque in the left main, left anterior descending and right coronary arteries. Mediastinum/Nodes: No pathologically enlarged mediastinal or hilar lymph nodes. Esophagus is unremarkable in appearance. Lungs/Pleura: Patchy areas of ground-glass attenuation and interlobular septal thickening are noted the lung bases  bilaterally, particularly in the lower lobes, which may suggest sequela of recent mild aspiration. No confluent consolidative  airspace disease. Trace right pleural effusion with some mild right pleural thickening. Probable area of subsegmental atelectasis in the medial aspect of the lingula. Musculoskeletal: There are no aggressive appearing lytic or blastic lesions noted in the visualized portions of the skeleton. Review of the MIP images confirms the above findings. CT ABDOMEN and PELVIS FINDINGS Hepatobiliary: Sub cm low-attenuation lesion in segment 2 of the liver, too small to characterize, but likely a cyst. No other suspicious hepatic lesions are noted. No intra or extrahepatic biliary ductal dilatation. Gallbladder is nearly decompressed, but otherwise unremarkable in appearance. Pancreas: No pancreatic mass. No pancreatic ductal dilatation. No pancreatic or peripancreatic fluid or inflammatory changes. Spleen: Unremarkable. Adrenals/Urinary Tract: 2.8 cm lesion at the junction of upper and interpolar regions of the right kidney is compatible with a simple cyst exophytic simple cyst measuring 1.5 cm in the interpolar region of the left kidney. Several other sub cm low-attenuation lesions in the kidneys bilaterally too small to definitively characterize, but are also favored to represent tiny cysts. No hydroureteronephrosis. Urinary bladder is normal in appearance. Right adrenal gland is normal in appearance. Mild adreniform thickening of the left adrenal gland, likely related to mild left adrenal hyperplasia. Stomach/Bowel: The appearance of the stomach is normal. There is no pathologic dilatation of small bowel or colon. Numerous colonic diverticulae are noted, without surrounding inflammatory changes to suggest an acute diverticulitis at this time. Vascular/Lymphatic: Aortic atherosclerosis, without evidence of aneurysm or dissection in the abdominal or pelvic vasculature. No lymphadenopathy noted in the abdomen or pelvis. Reproductive: Status post radical prostatectomy. Surgical clips along the pelvic side wall from prior lymph  node dissection. Other: No significant volume of ascites.  No pneumoperitoneum. Musculoskeletal: Bilateral pars defects at L4 with 17 mm of anterolisthesis of L4 upon L5. Chronic appearing compression fracture of T11 with approximately 50% loss of anterior vertebral body height. There are no aggressive appearing lytic or blastic lesions noted in the visualized portions of the skeleton. Review of the MIP images confirms the above findings. IMPRESSION: 1. Small subsegmental sized pulmonary embolism in the right lower lobe. This is of uncertain clinical significance. No larger central, lobar or segmental sized embolus is noted. 2. There are patchy areas of ground-glass attenuation and septal thickening throughout the lung bases bilaterally, suggesting sequela of recent aspiration. 3. Aortic atherosclerosis, in addition to left main and 2 vessel coronary artery disease. Please note that although the presence of coronary artery calcium documents the presence of coronary artery disease, the severity of this disease and any potential stenosis cannot be assessed on this non-gated CT examination. Assessment for potential risk factor modification, dietary therapy or pharmacologic therapy may be warranted, if clinically indicated. 4. Postoperative changes of radical prostatectomy and pelvic lymph node dissection, without findings to suggest local recurrence of disease or definite metastatic disease. 5. Grade 2 spondylolisthesis of L4 upon L5. 6. Additional incidental findings, as above. Electronically Signed   By: Vinnie Langton M.D.   On: 11/14/2015 13:18   Dg Chest Portable 1 View  Result Date: 11/14/2015 CLINICAL DATA:  Worsening right upper quadrant pain, some shortness of breath, history of prostate carcinoma EXAM: PORTABLE CHEST 1 VIEW COMPARISON:  None. FINDINGS: The lungs are not optimally aerated with particularly right basilar atelectasis or scarring present. No pneumonia or effusion is seen. Mediastinal and  hilar contours are unremarkable. The heart is mildly enlarged. No bony  abnormality is seen. IMPRESSION: Suboptimal inspiration with mild right basilar atelectasis or scarring. No definite active process. Electronically Signed   By: Ivar Drape M.D.   On: 11/14/2015 10:03   US Abdomen Limited Ruq  Result Date: 11/14/2015 CLINICAL DATA:  Right upper quadrant pain.  Prostate cancer. EXAM: US ABDOMEN LIMITED - RIGHT UPPER QUADRANT COMPARISON:  CT 07/12/2011. FINDINGS: Gallbladder: No gallstones or wall thickening visualized. No sonographic Murphy sign noted by sonographer. Common bile duct: Diameter: 2.9 mm Liver: Stable 1.1 cm cyst medial portion right lobe of liver. No other focal hepatic abnormality identified. Liver is mildly echogenic. Fatty infiltration and/or hepatocellular disease cannot be excluded. IMPRESSION: 1.  No evidence of gallstones or biliary distention. 2. Stable 1.1 cm cyst medial portion of the right lobe of the liver. This is unchanged from prior CT of 07/12/2011 and most likely a benign cyst. Liver is slightly echogenic. Fatty infiltration and/or hepatocellular disease cannot be excluded. Electronically Signed   By: Marcello Moores  Register   On: 11/14/2015 11:05    EKG: Independently reviewed.   Assessment/Plan Principal Problem:   Pulmonary embolism (HCC) Active Problems:   Pleuritic chest pain   RUQ abdominal pain   History of prostate cancer   1. Pulmonary embolism as detailed in CTA report above-admitted for IV anticoagulation with heparin. Pharmacist is pink consult to assist with heparin management. Supportive therapy ordered. Monitor coags and heparin levels appropriately. 2. Pleuritic chest pain-likely this is secondary to the pulmonary embolus however given the extent of coronary artery disease seen on CT would cycle troponin. 3. Right upper quadrant abdominal pain-No evidence of gallstones or biliary distention noted on ultrasound or CT scan however, he does have fatty  infiltration of the liver and hepatocellular disease could not be excluded. If pain persists would need further evaluation.   4. History of prostate cancer - patient reports that he has completed treatment for this.     DVT Prophylaxis: heparin infusion Code Status: full  Family Communication:   Disposition Plan: tbd   Irwin Brakeman, MD [facility] Pager [phone]  If 7PM-7AM, please contact night-coverage [email or web address] Password [email or web address] 11/14/2015, 2:16 PM

## 2015-11-14 NOTE — ED Notes (Signed)
Pt unable to urinate at this time. Will collect specimen when pt. Voids. Nurse aware.

## 2015-11-14 NOTE — ED Notes (Signed)
Attempted IV placement in patient's right arm.  No success.  Tech getting EKG and rectal temp.  Will attempt again after EKG.

## 2015-11-14 NOTE — ED Provider Notes (Signed)
[facility] DEPT Provider Note   CSN: [id number] Arrival date & time: 11/14/15  [id number]     History   Chief Complaint Chief Complaint  Patient presents with  . Abdominal Pain    HPI COPEN SHEELEY is a 80 y.o. male.  HPI 80 year old male with a history of prostate cancer recently treated with chemotherapy presents with right upper quadrant pain since last night. States it started around 8 PM while he was sitting in a chair and has been constant since. Certain movements or positions improve the pain. He has not had anything to eat or drink since it started. No nausea, vomiting, chest pain, or shortness of breath. However deep inspiration worsens the pain. No radiation of the pain or back pain. Chronic constipation but no worsening or new diarrhea. Denies fevers. Has not taken anything for the pain. Prior history of lower abdominal surgery for cancer but denies any other abdominal surgeries or cholecystectomy. No dizziness.  Past Medical History:  Diagnosis Date  . Prostate cancer [facility])     Patient Active Problem List   Diagnosis Date Noted  . Pulmonary embolism ([place]) 11/14/2015  . Pleuritic chest pain 11/14/2015  . RUQ abdominal pain 11/14/2015  . History of prostate cancer 11/14/2015  . Plantar fasciitis of right foot 07/05/2015  . Ingrown nail 05/16/2015  . Paronychia of great toe, left 05/16/2015    History reviewed. No pertinent surgical history.     Home Medications    Prior to Admission medications   Medication Sig Start Date End Date Taking? Authorizing Provider  aspirin 325 MG tablet Take 325 mg by mouth at bedtime.   Yes Historical Provider, MD  cholecalciferol (VITAMIN D) 1000 units tablet Take 1,000 Units by mouth at bedtime.   Yes Historical Provider, MD  hydroxypropyl methylcellulose / hypromellose (ISOPTO TEARS / GONIOVISC) 2.5 % ophthalmic solution Place 1 drop into both eyes 3 (three) times daily as needed for dry eyes.   Yes Historical Provider, MD    Linaclotide Rolan Lipa) 145 MCG CAPS capsule Take 145 mcg by mouth daily before breakfast. As needed for constipation 01/04/15  Yes Historical Provider, MD    Family History History reviewed. No pertinent family history.  Social History Social History  Substance Use Topics  . Smoking status: Never Smoker  . Smokeless tobacco: Never Used  . Alcohol use Not on file     Allergies   Review of patient's allergies indicates no known allergies.   Review of Systems Review of Systems  Constitutional: Negative for fever.  Respiratory: Negative for cough and shortness of breath.   Cardiovascular: Negative for chest pain.  Gastrointestinal: Positive for abdominal pain and constipation. Negative for diarrhea, nausea and vomiting.  Genitourinary: Negative for dysuria.  Musculoskeletal: Negative for back pain.  All other systems reviewed and are negative.    Physical Exam Updated Vital Signs BP 132/69 (BP Location: Right Arm)   Pulse 86   Temp 99.8 F (37.7 C) (Oral)   Resp 20   Ht 6' (1.829 m)   Wt 240 lb (108.9 kg)   SpO2 98%   BMI 32.55 kg/m   Physical Exam  Constitutional: He is oriented to person, place, and time. He appears well-developed and well-nourished. No distress.  HENT:  Head: Normocephalic and atraumatic.  Right Ear: External ear normal.  Left Ear: External ear normal.  Nose: Nose normal.  Eyes: Right eye exhibits no discharge. Left eye exhibits no discharge.  Neck: Neck supple.  Cardiovascular: Normal rate, regular  rhythm and normal heart sounds.   Pulmonary/Chest: Effort normal and breath sounds normal.  Abdominal: Soft. There is tenderness in the right upper quadrant.  Musculoskeletal: He exhibits no edema.  Neurological: He is alert and oriented to person, place, and time.  Skin: Skin is warm and dry. He is not diaphoretic.  Nursing note and vitals reviewed.    ED Treatments / Results  Labs (all labs ordered are listed, but only abnormal results  are displayed) Labs Reviewed  COMPREHENSIVE METABOLIC PANEL - Abnormal; Notable for the following:       Result Value   Glucose, Bld 123 (*)    BUN 21 (*)    Calcium 8.8 (*)    Total Protein 6.1 (*)    ALT 12 (*)    All other components within normal limits  CBC WITH DIFFERENTIAL/PLATELET - Abnormal; Notable for the following:    RBC 3.82 (*)    Hemoglobin 11.7 (*)    HCT 36.5 (*)    Platelets 125 (*)    Neutro Abs 8.3 (*)    Lymphs Abs 0.3 (*)    Monocytes Absolute 1.1 (*)    All other components within normal limits  LIPASE, BLOOD  URINALYSIS, ROUTINE W REFLEX MICROSCOPIC (NOT AT [facility])  HEPARIN LEVEL (UNFRACTIONATED)  TROPONIN I  TROPONIN I  CBC  COMPREHENSIVE METABOLIC PANEL  I-STAT CG4 LACTIC ACID, ED  I-STAT TROPOININ, ED    EKG  EKG Interpretation  Date/Time:  Tuesday November 14 2015 10:16:56 EDT Ventricular Rate:  87 PR Interval:    QRS Duration: 94 QT Interval:  368 QTC Calculation: 443 R Axis:   -26 Text Interpretation:  Normal sinus rhythm Inferior infarct, old Baseline wander in lead(s) II V2 No old tracing to compare Confirmed by Keeanna Villafranca MD, Melaya [place] [phone]) on 11/14/2015 11:35:45 AM       Radiology Ct Angio Chest Pe W/cm &/or Wo Cm  Result Date: 11/14/2015 CLINICAL DATA:  80 year old male with history of metastatic prostate cancer originally diagnosed in 1994. Complaining of sharp right upper quadrant abdominal pain since 8 p.m. yesterday evening. Pain worsens with deep inspiration. EXAM: CT ANGIOGRAPHY CHEST CT ABDOMEN AND PELVIS WITH CONTRAST TECHNIQUE: Multidetector CT imaging of the chest was performed using the standard protocol during bolus administration of intravenous contrast. Multiplanar CT image reconstructions and MIPs were obtained to evaluate the vascular anatomy. Multidetector CT imaging of the abdomen and pelvis was performed using the standard protocol during bolus administration of intravenous contrast. CONTRAST:  100 mL of Isovue 370.  COMPARISON:  No priors. FINDINGS: CTA CHEST FINDINGS Cardiovascular: There is a small nonobstructive filling defect within the subsegmental sized pulmonary artery branches to the right lower lobe (best appreciated on images 187-197 of series 7). No other central, lobar or segmental sized filling defects are noted to suggest larger pulmonary emboli. Heart size is normal. There is no significant pericardial fluid, thickening or pericardial calcification. There is aortic atherosclerosis, as well as atherosclerosis of the great vessels of the mediastinum and the coronary arteries, including calcified atherosclerotic plaque in the left main, left anterior descending and right coronary arteries. Mediastinum/Nodes: No pathologically enlarged mediastinal or hilar lymph nodes. Esophagus is unremarkable in appearance. Lungs/Pleura: Patchy areas of ground-glass attenuation and interlobular septal thickening are noted the lung bases bilaterally, particularly in the lower lobes, which may suggest sequela of recent mild aspiration. No confluent consolidative airspace disease. Trace right pleural effusion with some mild right pleural thickening. Probable area of subsegmental atelectasis in the  medial aspect of the lingula. Musculoskeletal: There are no aggressive appearing lytic or blastic lesions noted in the visualized portions of the skeleton. Review of the MIP images confirms the above findings. CT ABDOMEN and PELVIS FINDINGS Hepatobiliary: Sub cm low-attenuation lesion in segment 2 of the liver, too small to characterize, but likely a cyst. No other suspicious hepatic lesions are noted. No intra or extrahepatic biliary ductal dilatation. Gallbladder is nearly decompressed, but otherwise unremarkable in appearance. Pancreas: No pancreatic mass. No pancreatic ductal dilatation. No pancreatic or peripancreatic fluid or inflammatory changes. Spleen: Unremarkable. Adrenals/Urinary Tract: 2.8 cm lesion at the junction of upper and  interpolar regions of the right kidney is compatible with a simple cyst exophytic simple cyst measuring 1.5 cm in the interpolar region of the left kidney. Several other sub cm low-attenuation lesions in the kidneys bilaterally too small to definitively characterize, but are also favored to represent tiny cysts. No hydroureteronephrosis. Urinary bladder is normal in appearance. Right adrenal gland is normal in appearance. Mild adreniform thickening of the left adrenal gland, likely related to mild left adrenal hyperplasia. Stomach/Bowel: The appearance of the stomach is normal. There is no pathologic dilatation of small bowel or colon. Numerous colonic diverticulae are noted, without surrounding inflammatory changes to suggest an acute diverticulitis at this time. Vascular/Lymphatic: Aortic atherosclerosis, without evidence of aneurysm or dissection in the abdominal or pelvic vasculature. No lymphadenopathy noted in the abdomen or pelvis. Reproductive: Status post radical prostatectomy. Surgical clips along the pelvic side wall from prior lymph node dissection. Other: No significant volume of ascites.  No pneumoperitoneum. Musculoskeletal: Bilateral pars defects at L4 with 17 mm of anterolisthesis of L4 upon L5. Chronic appearing compression fracture of T11 with approximately 50% loss of anterior vertebral body height. There are no aggressive appearing lytic or blastic lesions noted in the visualized portions of the skeleton. Review of the MIP images confirms the above findings. IMPRESSION: 1. Small subsegmental sized pulmonary embolism in the right lower lobe. This is of uncertain clinical significance. No larger central, lobar or segmental sized embolus is noted. 2. There are patchy areas of ground-glass attenuation and septal thickening throughout the lung bases bilaterally, suggesting sequela of recent aspiration. 3. Aortic atherosclerosis, in addition to left main and 2 vessel coronary artery disease. Please  note that although the presence of coronary artery calcium documents the presence of coronary artery disease, the severity of this disease and any potential stenosis cannot be assessed on this non-gated CT examination. Assessment for potential risk factor modification, dietary therapy or pharmacologic therapy may be warranted, if clinically indicated. 4. Postoperative changes of radical prostatectomy and pelvic lymph node dissection, without findings to suggest local recurrence of disease or definite metastatic disease. 5. Grade 2 spondylolisthesis of L4 upon L5. 6. Additional incidental findings, as above. Electronically Signed   By: Vinnie Langton M.D.   On: 11/14/2015 13:18   Ct Abdomen Pelvis W Contrast  Result Date: 11/14/2015 CLINICAL DATA:  80 year old male with history of metastatic prostate cancer originally diagnosed in 1994. Complaining of sharp right upper quadrant abdominal pain since 8 p.m. yesterday evening. Pain worsens with deep inspiration. EXAM: CT ANGIOGRAPHY CHEST CT ABDOMEN AND PELVIS WITH CONTRAST TECHNIQUE: Multidetector CT imaging of the chest was performed using the standard protocol during bolus administration of intravenous contrast. Multiplanar CT image reconstructions and MIPs were obtained to evaluate the vascular anatomy. Multidetector CT imaging of the abdomen and pelvis was performed using the standard protocol during bolus administration of intravenous contrast.  CONTRAST:  100 mL of Isovue 370. COMPARISON:  No priors. FINDINGS: CTA CHEST FINDINGS Cardiovascular: There is a small nonobstructive filling defect within the subsegmental sized pulmonary artery branches to the right lower lobe (best appreciated on images 187-197 of series 7). No other central, lobar or segmental sized filling defects are noted to suggest larger pulmonary emboli. Heart size is normal. There is no significant pericardial fluid, thickening or pericardial calcification. There is aortic atherosclerosis,  as well as atherosclerosis of the great vessels of the mediastinum and the coronary arteries, including calcified atherosclerotic plaque in the left main, left anterior descending and right coronary arteries. Mediastinum/Nodes: No pathologically enlarged mediastinal or hilar lymph nodes. Esophagus is unremarkable in appearance. Lungs/Pleura: Patchy areas of ground-glass attenuation and interlobular septal thickening are noted the lung bases bilaterally, particularly in the lower lobes, which may suggest sequela of recent mild aspiration. No confluent consolidative airspace disease. Trace right pleural effusion with some mild right pleural thickening. Probable area of subsegmental atelectasis in the medial aspect of the lingula. Musculoskeletal: There are no aggressive appearing lytic or blastic lesions noted in the visualized portions of the skeleton. Review of the MIP images confirms the above findings. CT ABDOMEN and PELVIS FINDINGS Hepatobiliary: Sub cm low-attenuation lesion in segment 2 of the liver, too small to characterize, but likely a cyst. No other suspicious hepatic lesions are noted. No intra or extrahepatic biliary ductal dilatation. Gallbladder is nearly decompressed, but otherwise unremarkable in appearance. Pancreas: No pancreatic mass. No pancreatic ductal dilatation. No pancreatic or peripancreatic fluid or inflammatory changes. Spleen: Unremarkable. Adrenals/Urinary Tract: 2.8 cm lesion at the junction of upper and interpolar regions of the right kidney is compatible with a simple cyst exophytic simple cyst measuring 1.5 cm in the interpolar region of the left kidney. Several other sub cm low-attenuation lesions in the kidneys bilaterally too small to definitively characterize, but are also favored to represent tiny cysts. No hydroureteronephrosis. Urinary bladder is normal in appearance. Right adrenal gland is normal in appearance. Mild adreniform thickening of the left adrenal gland, likely  related to mild left adrenal hyperplasia. Stomach/Bowel: The appearance of the stomach is normal. There is no pathologic dilatation of small bowel or colon. Numerous colonic diverticulae are noted, without surrounding inflammatory changes to suggest an acute diverticulitis at this time. Vascular/Lymphatic: Aortic atherosclerosis, without evidence of aneurysm or dissection in the abdominal or pelvic vasculature. No lymphadenopathy noted in the abdomen or pelvis. Reproductive: Status post radical prostatectomy. Surgical clips along the pelvic side wall from prior lymph node dissection. Other: No significant volume of ascites.  No pneumoperitoneum. Musculoskeletal: Bilateral pars defects at L4 with 17 mm of anterolisthesis of L4 upon L5. Chronic appearing compression fracture of T11 with approximately 50% loss of anterior vertebral body height. There are no aggressive appearing lytic or blastic lesions noted in the visualized portions of the skeleton. Review of the MIP images confirms the above findings. IMPRESSION: 1. Small subsegmental sized pulmonary embolism in the right lower lobe. This is of uncertain clinical significance. No larger central, lobar or segmental sized embolus is noted. 2. There are patchy areas of ground-glass attenuation and septal thickening throughout the lung bases bilaterally, suggesting sequela of recent aspiration. 3. Aortic atherosclerosis, in addition to left main and 2 vessel coronary artery disease. Please note that although the presence of coronary artery calcium documents the presence of coronary artery disease, the severity of this disease and any potential stenosis cannot be assessed on this non-gated CT examination. Assessment for  potential risk factor modification, dietary therapy or pharmacologic therapy may be warranted, if clinically indicated. 4. Postoperative changes of radical prostatectomy and pelvic lymph node dissection, without findings to suggest local recurrence of  disease or definite metastatic disease. 5. Grade 2 spondylolisthesis of L4 upon L5. 6. Additional incidental findings, as above. Electronically Signed   By: Vinnie Langton M.D.   On: 11/14/2015 13:18   Dg Chest Portable 1 View  Result Date: 11/14/2015 CLINICAL DATA:  Worsening right upper quadrant pain, some shortness of breath, history of prostate carcinoma EXAM: PORTABLE CHEST 1 VIEW COMPARISON:  None. FINDINGS: The lungs are not optimally aerated with particularly right basilar atelectasis or scarring present. No pneumonia or effusion is seen. Mediastinal and hilar contours are unremarkable. The heart is mildly enlarged. No bony abnormality is seen. IMPRESSION: Suboptimal inspiration with mild right basilar atelectasis or scarring. No definite active process. Electronically Signed   By: Ivar Drape M.D.   On: 11/14/2015 10:03   US Abdomen Limited Ruq  Result Date: 11/14/2015 CLINICAL DATA:  Right upper quadrant pain.  Prostate cancer. EXAM: US ABDOMEN LIMITED - RIGHT UPPER QUADRANT COMPARISON:  CT 07/12/2011. FINDINGS: Gallbladder: No gallstones or wall thickening visualized. No sonographic Murphy sign noted by sonographer. Common bile duct: Diameter: 2.9 mm Liver: Stable 1.1 cm cyst medial portion right lobe of liver. No other focal hepatic abnormality identified. Liver is mildly echogenic. Fatty infiltration and/or hepatocellular disease cannot be excluded. IMPRESSION: 1.  No evidence of gallstones or biliary distention. 2. Stable 1.1 cm cyst medial portion of the right lobe of the liver. This is unchanged from prior CT of 07/12/2011 and most likely a benign cyst. Liver is slightly echogenic. Fatty infiltration and/or hepatocellular disease cannot be excluded. Electronically Signed   By: Marcello Moores  Register   On: 11/14/2015 11:05    Procedures Procedures (including critical care time)  Medications Ordered in ED Medications  heparin ADULT infusion 100 units/mL (25000 units/271mL sodium chloride  0.45%) (1,600 Units/hr Intravenous New Bag/Given 11/14/15 1442)  sodium chloride flush (NS) 0.9 % injection 3 mL (not administered)  acetaminophen (TYLENOL) tablet 650 mg (not administered)    Or  acetaminophen (TYLENOL) suppository 650 mg (not administered)  HYDROcodone-acetaminophen (NORCO/VICODIN) 5-325 MG per tablet 1-2 tablet (1 tablet Oral Given 11/14/15 1648)  ondansetron (ZOFRAN) tablet 4 mg (not administered)    Or  ondansetron (ZOFRAN) injection 4 mg (not administered)  senna-docusate (Senokot-S) tablet 1 tablet (not administered)  bisacodyl (DULCOLAX) suppository 10 mg (not administered)  morphine 2 MG/ML injection 2 mg (not administered)  polyvinyl alcohol (LIQUIFILM TEARS) 1.4 % ophthalmic solution 1 drop (not administered)  sodium chloride 0.9 % bolus 1,000 mL (0 mLs Intravenous Stopped 11/14/15 1217)  fentaNYL (SUBLIMAZE) injection 50 mcg (50 mcg Intravenous Given 11/14/15 1028)  iopamidol (ISOVUE-370) 76 % injection 100 mL (100 mLs Intravenous Contrast Given 11/14/15 1237)  fentaNYL (SUBLIMAZE) injection 50 mcg (50 mcg Intravenous Given 11/14/15 1303)  heparin bolus via infusion 3,000 Units (3,000 Units Intravenous Bolus from Bag 11/14/15 1443)     Initial Impression / Assessment and Plan / ED Course  I have reviewed the triage vital signs and the nursing notes.  Pertinent labs & imaging results that were available during my care of the patient were reviewed by me and considered in my medical decision making (see chart for details).  Clinical Course  Comment By Time  This is probably abdominal/gallbladder. However with history of cancer will need to r/o PE if u/s is unremarkable.  However there is no dyspnea or hypoxia. Initial BP 85/58 but close recheck is now normal. Will give fluids. Sherwood Gambler, MD 10/03 [phone]  Pain is improved. Lab work shows no acute pathology and initial chest x-ray and right upper quadrant ultrasound are benign. Will get CT scan to rule out PE as well  as CT abdomen pelvis given his focal upper abdominal tenderness. Sherwood Gambler, MD 10/03 1124    Patient CT scans show subsegmental PE in the right lower lobe. This is right where he is having pain and is currently symptom attic. He does feel short of breath. While he is not in distress or having significant vital sign abnormalities, I think he needs to be observed and started on anticoagulation with his cancer history. Discussed with hospitalist who will admit.  Final Clinical Impressions(s) / ED Diagnoses   Final diagnoses:  RUQ pain    New Prescriptions Current Discharge Medication List       Sherwood Gambler, MD 11/14/15 [phone]

## 2015-11-14 NOTE — Progress Notes (Addendum)
ANTICOAGULATION CONSULT NOTE - Initial Consult  Pharmacy Consult for heparin Indication: new pulmonary embolus  No Known Allergies  Patient Measurements: weight 109 kg, height 72 inches   Heparin Dosing Weight: 100 kg  Vital Signs: Temp: 99.7 F (37.6 C) (10/03 1023) Temp Source: Rectal (10/03 1023) BP: 140/81 (10/03 1300) Pulse Rate: 88 (10/03 1300)  Labs:  Recent Labs  11/14/15 0943  HGB 11.7*  HCT 36.5*  PLT 125*  CREATININE 0.94    CrCl cannot be calculated (Unknown ideal weight.).   Medical History: History reviewed. No pertinent past medical history.   Assessment: Patient is a 80 y.o M presented to the ED on 11/14/15 with c/o CP.  CTA showed RLL small subsegmental PE.  To start heparin for PE.  10/3 CTA: Small subsegmental sized pulmonary embolism in the right lower lobe  Goal of Therapy:  Heparin level 0.3-0.7 units/ml Monitor platelets by anticoagulation protocol: Yes   Plan:  - heparin 3000 units IV x1 bolus, then 1600 units/hr (per [place] method) - check 8 hr heparin level - monitor for s/s bleeding  Videl Nobrega P 11/14/2015,1:58 PM

## 2015-11-14 NOTE — Progress Notes (Signed)
**  Preliminary report by tech**  Bilateral lower extremity venous duplex completed. There is no evidence of deep or superficial vein thrombosis involving the right and left lower extremities. All visualized vessels appear patent and compressible. There is no evidence of Baker's cysts bilaterally. Results given to the patient's nurse, [facility].  11/14/15 4:53 PM Carlos Levering RVT

## 2015-11-14 NOTE — ED Notes (Signed)
Patient still unable to give urine sample.

## 2015-11-15 DIAGNOSIS — Z8546 Personal history of malignant neoplasm of prostate: Secondary | ICD-10-CM

## 2015-11-15 DIAGNOSIS — D696 Thrombocytopenia, unspecified: Secondary | ICD-10-CM | POA: Diagnosis present

## 2015-11-15 DIAGNOSIS — R0781 Pleurodynia: Secondary | ICD-10-CM | POA: Diagnosis not present

## 2015-11-15 DIAGNOSIS — I2699 Other pulmonary embolism without acute cor pulmonale: Secondary | ICD-10-CM

## 2015-11-15 LAB — COMPREHENSIVE METABOLIC PANEL
ALBUMIN: 3.1 g/dL — AB (ref 3.5–5.0)
ALK PHOS: 38 U/L (ref 38–126)
ALT: 9 U/L — ABNORMAL LOW (ref 17–63)
ANION GAP: 7 (ref 5–15)
AST: 10 U/L — ABNORMAL LOW (ref 15–41)
BILIRUBIN TOTAL: 1.1 mg/dL (ref 0.3–1.2)
BUN: 19 mg/dL (ref 6–20)
CALCIUM: 8.4 mg/dL — AB (ref 8.9–10.3)
CO2: 24 mmol/L (ref 22–32)
CREATININE: 0.81 mg/dL (ref 0.61–1.24)
Chloride: 105 mmol/L (ref 101–111)
GFR calc non Af Amer: >60 mL/min (ref >60)
GLUCOSE: 127 mg/dL — AB (ref 65–99)
Potassium: 3.8 mmol/L (ref 3.5–5.1)
Sodium: 136 mmol/L (ref 135–145)
TOTAL PROTEIN: 5.6 g/dL — AB (ref 6.5–8.1)

## 2015-11-15 LAB — CBC
HCT: 33.5 % — ABNORMAL LOW (ref 39.0–52.0)
Hemoglobin: 10.7 g/dL — ABNORMAL LOW (ref 13.0–17.0)
MCH: 30.6 pg (ref 26.0–34.0)
MCHC: 31.9 g/dL (ref 30.0–36.0)
MCV: 95.7 fL (ref 78.0–100.0)
PLATELETS: 109 10*3/uL — AB (ref 150–400)
RBC: 3.5 MIL/uL — ABNORMAL LOW (ref 4.22–5.81)
RDW: 14.9 % (ref 11.5–15.5)
WBC: 6.3 10*3/uL (ref 4.0–10.5)

## 2015-11-15 LAB — HEPARIN LEVEL (UNFRACTIONATED): Heparin Unfractionated: 0.47 IU/mL (ref 0.30–0.70)

## 2015-11-15 MED ORDER — ASPIRIN 81 MG PO TABS: 81.0000 mg | ORAL_TABLET | Freq: Every day | ORAL | 0 refills | Status: DC

## 2015-11-15 MED ORDER — RIVAROXABAN (XARELTO) VTE STARTER PACK (15 & 20 MG): ORAL_TABLET | ORAL | 3 refills | Status: DC

## 2015-11-15 NOTE — Progress Notes (Signed)
ANTICOAGULATION CONSULT NOTE - Follow Up Consult  Pharmacy Consult for Heparin Indication: pulmonary embolus  No Known Allergies  Patient Measurements: Height: 6' (182.9 cm) Weight: 240 lb (108.9 kg) IBW/kg (Calculated) : 77.6 Heparin Dosing Weight:   Vital Signs: Temp: 99.1 F (37.3 C) (10/03 2120) Temp Source: Oral (10/03 2120) BP: 133/77 (10/03 2120) Pulse Rate: 82 (10/03 2120)  Labs:  Recent Labs  11/14/15 0943 11/14/15 1644 11/14/15 2239  HGB 11.7*  --   --   HCT 36.5*  --   --   PLT 125*  --   --   HEPARINUNFRC  --   --  0.31  CREATININE 0.94  --   --   TROPONINI  --  <0.03 <0.03    Estimated Creatinine Clearance: 82.5 mL/min (by C-G formula based on SCr of 0.94 mg/dL).   Medications:  Infusions:  . heparin 1,600 Units/hr (11/14/15 1442)    Assessment: Patient with heparin level at goal.  No heparin issues per RN.  Will increase rate to assure drip remains within goal, since at lower end of goal.  Goal of Therapy:  Heparin level 0.3-0.7 units/ml Monitor platelets by anticoagulation protocol: Yes   Plan:  Increase heparin to 1700 units/hr Recheck level at [place], [place] 11/15/2015,12:01 AM

## 2015-11-15 NOTE — Progress Notes (Signed)
ANTICOAGULATION CONSULT NOTE - Initial Consult  Pharmacy Consult for heparin Indication: new pulmonary embolus  No Known Allergies  Patient Measurements: weight 109 kg, height 72 inches Height: 6' (182.9 cm) Weight: 240 lb (108.9 kg) IBW/kg (Calculated) : 77.6 Heparin Dosing Weight: 100 kg  Vital Signs: Temp: 97.6 F (36.4 C) (10/04 0331) Temp Source: Oral (10/04 0331) BP: 133/83 (10/04 0331) Pulse Rate: 80 (10/04 0331)  Labs:  Recent Labs  11/14/15 0943 11/14/15 1644 11/14/15 2239 11/15/15 0506 11/15/15 0737  HGB 11.7*  --   --  10.7*  --   HCT 36.5*  --   --  33.5*  --   PLT 125*  --   --  109*  --   HEPARINUNFRC  --   --  0.31  --  0.47  CREATININE 0.94  --   --  0.81  --   TROPONINI  --  <0.03 <0.03  --   --     Estimated Creatinine Clearance: 95.8 mL/min (by C-G formula based on SCr of 0.81 mg/dL).   Medical History: Past Medical History:  Diagnosis Date  . Prostate cancer [facility])      Assessment: Patient is a 80 y.o M presented to the ED on 11/14/15 with c/o CP.  CTA showed RLL small subsegmental PE. LE doppler neg for DVT.  Heparin drip started for PE.  Today, 11/15/2015: -  hgb and plt down slightly - no bleeding documented - heparin level remains therapeutic at 0.47  Goal of Therapy:  Heparin level 0.3-0.7 units/ml Monitor platelets by anticoagulation protocol: Yes   Plan:  - continue heparin drip at 1700 units/hr - daily heparin level - monitor for s/s bleeding  Chandlar Guice P 11/15/2015,8:31 AM

## 2015-11-15 NOTE — Discharge Instructions (Signed)
Pulmonary Embolism A pulmonary embolism (PE) is a sudden blockage or decrease of blood flow in one lung or both lungs. Most blockages come from a blood clot that travels from the legs or the pelvis to the lungs. PE is a dangerous and potentially life-threatening condition if it is not treated right away. CAUSES A pulmonary embolism occurs most commonly when a blood clot travels from one of your veins to your lungs. Rarely, PE is caused by air, fat, amniotic fluid, or part of a tumor traveling through your veins to your lungs. RISK FACTORS A PE is more likely to develop in:  People who smoke.  People who areolder, especially over 87 years of age.  People who are overweight (obese).  People who sit or lie still for a long time, such as during long-distance travel (over 4 hours), bed rest, hospitalization, or during recovery from certain medical conditions like a stroke.  People who do not engage in much physical activity (sedentary lifestyle).  People who have chronic breathing disorders.  People whohave a personal or family history of blood clots or blood clotting disease.  People whohave peripheral vascular disease (PVD), diabetes, or some types of cancer.  People who haveheart disease, especially if the person had a recent heart attack or has congestive heart failure.  People who have neurological diseases that affect the legs (leg paresis).  People who have had a traumatic injury, such as breaking a hip or leg.  People whohave recently had major or lengthy surgery, especially on the hip, knee, or abdomen.  People who have hada central line placed inside a large vein.  People who takemedicines that contain the hormone estrogen. These include birth control pills and hormone replacement therapy.  Pregnancy or during childbirth or the postpartum period. SIGNS AND SYMPTOMS  The symptoms of a PE usually start suddenly and include:  Shortness of breath while active or at  rest.  Coughing or coughing up blood or blood-tinged mucus.  Chest pain that is often worse with deep breaths.  Rapid or irregular heartbeat.  Feeling light-headed or dizzy.  Fainting.  Feelinganxious.  Sweating. There may also be pain and swelling in a leg if that is where the blood clot started. These symptoms may represent a serious problem that is an emergency. Do not wait to see if the symptoms will go away. Get medical help right away. Call your local emergency services (911 in the [place] Do not drive yourself to the hospital. DIAGNOSIS Your health care provider will take a medical history and perform a physical exam. You may also have other tests, including:  Blood tests to assess the clotting properties of your blood, assess oxygen levels in your blood, and find blood clots.  Imaging tests, such as CT, ultrasound, MRI, X-ray, and other tests to see if you have clots anywhere in your body.  An electrocardiogram (ECG) to look for heart strain from blood clots in the lungs. TREATMENT The main goals of PE treatment are:  To stop a blood clot from growing larger.  To stop new blood clots from forming. The type of treatment that you receive depends on many factors, such as the cause of your PE, your risk for bleeding or developing more clots, and other medical conditions that you have. Sometimes, a combination of treatments is necessary. This condition may be treated with:  Medicines, including newer oral blood thinners (anticoagulants), warfarin, low molecular weight heparins, thrombolytics, or heparins.  Wearing compression stockings or using different  types of devices.  Surgery (rare) to remove the blood clot or to place a filter in your abdomen to stop the blood clot from traveling to your lungs. Treatments for a PE are often divided into immediate treatment, long-term treatment (up to 3 months after PE), and extended treatment (more than 3 months after PE). Your  treatment may continue for several months. This is called maintenance therapy, and it is used to prevent the forming of new blood clots. You can work with your health care provider to choose the treatment program that is best for you. What are anticoagulants? Anticoagulants are medicines that treat PEs. They can stop current blood clots from growing and stop new clots from forming. They cannot dissolve existing clots. Your body dissolves clots by itself over time. Anticoagulants are given by mouth, by injection, or through an IV tube. What are thrombolytics? Thrombolytics are clot-dissolving medicines that are used to dissolve a PE. They carry a high risk of bleeding, so they tend to be used only in severe cases or if you have very low blood pressure. HOME CARE INSTRUCTIONS If you are taking a newer oral anticoagulant:  Take the medicine every single day at the same time each day.  Understand what foods and drugs interact with this medicine.  Understand that there are no regular blood tests required when using this medicine.  Understandthe side effects of this medicine, including excessive bruising or bleeding. Ask your health care provider or pharmacist about other possible side effects. If you are taking warfarin:  Understand how to take warfarin and know which foods can affect how warfarin works in Veterinary surgeon.  Understand that it is dangerous to taketoo much or too little warfarin. Too much warfarin increases the risk of bleeding. Too little warfarin continues to allow the risk for blood clots.  Follow your PT and INR blood testing schedule. The PT and INR results allow your health care provider to adjust your dose of warfarin. It is very important that you have your PT and INR tested as often as told by your health care provider.  Avoid major changes in your diet, or tell your health care provider before you change your diet. Arrange a visit with a registered dietitian to answer your  questions. Many foods, especially foods that are high in vitamin K, can interfere with warfarin and affect the PT and INR results. Eat a consistent amount of foods that are high in vitamin K, such as:  Spinach, kale, broccoli, cabbage, collard greens, turnip greens, Brussels sprouts, peas, cauliflower, seaweed, and parsley.  Beef liver and pork liver.  Green tea.  Soybean oil.  Tell your health care provider about any and all medicines, vitamins, and supplements that you take, including aspirin and other over-the-counter anti-inflammatory medicines. Be especially cautious with aspirin and anti-inflammatory medicines. Do not take those before you ask your health care provider if it is safe to do so. This is important because many medicines can interfere with warfarin and affect the PT and INR results.  Do not start or stop taking any over-the-counter or prescription medicine unless your health care provider or pharmacist tells you to do so. If you take warfarin, you will also need to do these things:  Hold pressure over cuts for longer than usual.  Tell your dentist and other health care providers that you are taking warfarin before you have any procedures in which bleeding may occur.  Avoid alcohol or drink very small amounts. Tell your health care  provider if you change your alcohol intake.  Do not use tobacco products, including cigarettes, chewing tobacco, and e-cigarettes. If you need help quitting, ask your health care provider.  Avoid contact sports. General Instructions  Take over-the-counter and prescription medicines only as told by your health care provider. Anticoagulant medicines can have side effects, including easy bruising and difficulty stopping bleeding. If you are prescribed an anticoagulant, you will also need to do these things:  Hold pressure over cuts for longer than usual.  Tell your dentist and other health care providers that you are taking anticoagulants  before you have any procedures in which bleeding may occur.  Avoid contact sports.  Wear a medical alert bracelet or carry a medical alert card that says you have had a PE.  Ask your health care provider how soon you can go back to your normal activities. Stay active to prevent new blood clots from forming.  Make sure to exercise while traveling or when you have been sitting or standing for a long period of time. It is very important to exercise. Exercise your legs by walking or by tightening and relaxing your leg muscles often. Take frequent walks.  Wear compression stockings as told by your health care provider to help prevent more blood clots from forming.  Do not use tobacco products, including cigarettes, chewing tobacco, and e-cigarettes. If you need help quitting, ask your health care provider.  Keep all follow-up appointments with your health care provider. This is important. PREVENTION Take these actions to decrease your risk of developing another PE:  Exercise regularly. For at least 30 minutes every day, engage in:  Activity that involves moving your arms and legs.  Activity that encourages good blood flow through your body by increasing your heart rate.  Exercise your arms and legs every hour during long-distance travel (over 4 hours). Drink plenty of water and avoid drinking alcohol while traveling.  Avoid sitting or lying in bed for long periods of time without moving your legs.  Maintain a weight that is appropriate for your height. Ask your health care provider what weight is healthy for you.  If you are a woman who is over 42 years of age, avoid unnecessary use of medicines that contain estrogen. These include birth control pills.  Do not smoke, especially if you take estrogen medicines. If you need help quitting, ask your health care provider.  If you are at very high risk for PE, wear compression stockings.  If you recently had a PE, have regularly scheduled  ultrasound testing on your legs to check for new blood clots. If you are hospitalized, prevention measures may include:  Early walking after surgery, as soon as your health care provider says that it is safe.  Receiving anticoagulants to prevent blood clots. If you cannot take anticoagulants, other options may be available, such as wearing compression stockings or using different types of devices. SEEK IMMEDIATE MEDICAL CARE IF:  You have new or increased pain, swelling, or redness in an arm or leg.  You have numbness or tingling in an arm or leg.  You have shortness of breath while active or at rest.  You have chest pain.  You have a rapid or irregular heartbeat.  You feel light-headed or dizzy.  You cough up blood.  You notice blood in your vomit, bowel movement, or urine.  You have a fever. These symptoms may represent a serious problem that is an emergency. Do not wait to see if the symptoms  will go away. Get medical help right away. Call your local emergency services (911 in the [place] Do not drive yourself to the hospital.   This information is not intended to replace advice given to you by your health care provider. Make sure you discuss any questions you have with your health care provider.   Document Released: 01/26/2000 Document Revised: 10/19/2014 Document Reviewed: 05/25/2014 Elsevier Interactive Patient Education 2016 [employer]   Rivaroxaban oral tablets What is this medicine? RIVAROXABAN (ri va ROX a ban) is an anticoagulant (blood thinner). It is used to treat blood clots in the lungs or in the veins. It is also used after knee or hip surgeries to prevent blood clots. It is also used to lower the chance of stroke in people with a medical condition called atrial fibrillation. This medicine may be used for other purposes; ask your health care provider or pharmacist if you have questions. What should I tell my health care provider before I take this  medicine? They need to know if you have any of these conditions: -bleeding disorders -bleeding in the brain -blood in your stools (black or tarry stools) or if you have blood in your vomit -history of stomach bleeding -kidney disease -liver disease -low blood counts, like low white cell, platelet, or red cell counts -recent or planned spinal or epidural procedure -take medicines that treat or prevent blood clots -an unusual or allergic reaction to rivaroxaban, other medicines, foods, dyes, or preservatives -pregnant or trying to get pregnant -breast-feeding How should I use this medicine? Take this medicine by mouth with a glass of water. Follow the directions on the prescription label. Take your medicine at regular intervals. Do not take it more often than directed. Do not stop taking except on your doctor's advice. Stopping this medicine may increase your risk of a blood clot. Be sure to refill your prescription before you run out of medicine. If you are taking this medicine after hip or knee replacement surgery, take it with or without food. If you are taking this medicine for atrial fibrillation, take it with your evening meal. If you are taking this medicine to treat blood clots, take it with food at the same time each day. If you are unable to swallow your tablet, you may crush the tablet and mix it in applesauce. Then, immediately eat the applesauce. You should eat more food right after you eat the applesauce containing the crushed tablet. Talk to your pediatrician regarding the use of this medicine in children. Special care may be needed. Overdosage: If you think you have taken too much of this medicine contact a poison control center or emergency room at once. NOTE: This medicine is only for you. Do not share this medicine with others. What if I miss a dose? If you take your medicine once a day and miss a dose, take the missed dose as soon as you remember. If you take your medicine  twice a day and miss a dose, take the missed dose immediately. In this instance, 2 tablets may be taken at the same time. The next day you should take 1 tablet twice a day as directed. What may interact with this medicine? -aspirin and aspirin-like medicines -certain antibiotics like erythromycin, azithromycin, and clarithromycin -certain medicines for fungal infections like ketoconazole and itraconazole -certain medicines for irregular heart beat like amiodarone, quinidine, dronedarone -certain medicines for seizures like carbamazepine, phenytoin -certain medicines that treat or prevent blood clots like warfarin, enoxaparin, and dalteparin -conivaptan -  diltiazem -felodipine -indinavir -lopinavir; ritonavir -NSAIDS, medicines for pain and inflammation, like ibuprofen or naproxen -ranolazine -rifampin -ritonavir -St. John's wort -verapamil This list may not describe all possible interactions. Give your health care provider a list of all the medicines, herbs, non-prescription drugs, or dietary supplements you use. Also tell them if you smoke, drink alcohol, or use illegal drugs. Some items may interact with your medicine. What should I watch for while using this medicine? Visit your doctor or health care professional for regular checks on your progress. Your condition will be monitored carefully while you are receiving this medicine. Notify your doctor or health care professional and seek emergency treatment if you develop breathing problems; changes in vision; chest pain; severe, sudden headache; pain, swelling, warmth in the leg; trouble speaking; sudden numbness or weakness of the face, arm, or leg. These can be signs that your condition has gotten worse. If you are going to have surgery, tell your doctor or health care professional that you are taking this medicine. Tell your health care professional that you use this medicine before you have a spinal or epidural procedure. Sometimes people  who take this medicine have bleeding problems around the spine when they have a spinal or epidural procedure. This bleeding is very rare. If you have a spinal or epidural procedure while on this medicine, call your health care professional immediately if you have back pain, numbness or tingling (especially in your legs and feet), muscle weakness, paralysis, or loss of bladder or bowel control. Avoid sports and activities that might cause injury while you are using this medicine. Severe falls or injuries can cause unseen bleeding. Be careful when using sharp tools or knives. Consider using an Copy. Take special care brushing or flossing your teeth. Report any injuries, bruising, or red spots on the skin to your doctor or health care professional. What side effects may I notice from receiving this medicine? Side effects that you should report to your doctor or health care professional as soon as possible: -allergic reactions like skin rash, itching or hives, swelling of the face, lips, or tongue -back pain -redness, blistering, peeling or loosening of the skin, including inside the mouth -signs and symptoms of bleeding such as bloody or black, tarry stools; red or dark-brown urine; spitting up blood or brown material that looks like coffee grounds; red spots on the skin; unusual bruising or bleeding from the eye, gums, or nose Side effects that usually do not require medical attention (Report these to your doctor or health care professional if they continue or are bothersome.): -dizziness -muscle pain This list may not describe all possible side effects. Call your doctor for medical advice about side effects. You may report side effects to FDA at 1-800-FDA-1088. Where should I keep my medicine? Keep out of the reach of children. Store at room temperature between 15 and 30 degrees C (59 and 86 degrees F). Throw away any unused medicine after the expiration date. NOTE: This sheet is a summary.  It may not cover all possible information. If you have questions about this medicine, talk to your doctor, pharmacist, or health care provider.    2016, Elsevier/[employer] Standard. (2014-01-26 12:45:34)

## 2015-11-15 NOTE — Discharge Summary (Signed)
Physician Discharge Summary  Nicolas Woodward [id number] DOB: [birth year] DOA: 11/14/2015  PCP: Marton Redwood, MD  Admit date: 11/14/2015 Discharge date: 11/15/2015  Admitted From: Home Disposition:  Home  Recommendations for Outpatient Follow-up:  1. Follow up with PCP in 1-2 weeks 2. Patient is being started on Xarelto for anticoagulation for acute PE. Needs to be anticoagulated for at least 6 months. 3. Please follow platelets dating outpatient visit.  Home Health: None Equipment/Devices: None  Discharge Condition: Stable CODE STATUS: Full code Diet recommendation: Regular   Discharge Diagnoses:  Principal Problem:   Pulmonary embolism (HCC)   Active Problems:   Pleuritic chest pain   RUQ abdominal pain   History of prostate cancer   Thrombocytopenia (HCC)  Brief narrative/history of present illness 80 year old male with history of Versed cancer, recently treated with chemotherapy about 6 weeks back presented to the ED with right upper quadrant and right-sided chest pain since the evening prior to admission. Pain was sudden in onset worsened with deep inspiration. Denied any nausea, vomiting, shortness of breath or palpitation. Denies recent travel or chest trauma. Denies recent surgery. In the ED he was hypotensive with systolic blood pressure in the 80s. O2 sat was normal. A CT angiogram of the chest done showed a small subsegmental sized pulmonary embolism in the right lower lobe. Also showed patchy area of groundglass attenuation with septal thickening?recent aspiration. Patient admitted on telemetry and started on IV heparin.  Hospital course Acute right subsegmental pulmonary embolism Satisfactory is possibly underlying prostate cancer with recent chemotherapy. Patient started on empiric heparin. Stable on telemetry overnight and no further chest discomfort or dyspnea. Doppler of bilateral lower extremity negative for DVT. Patient educated on need for  anticoagulation and options provided. He chooses to be on newer oral anticoagulant. I will start him on oral Xarelto with recommendations for at least 6 months of treatment. I will reduce his aspirin to 81 mg daily to decrease risk of bleeding. Patient will be provided with education on pulmonary embolism and anticoagulation.  He is stable to be discharged home.  Prostate cancer Follows with oncologist at [facility]  Thrombocytopenia Mild. Possibly due to recent chemotherapy. On reviewing labs at outside hospital 6 weeks back his platelets were normal. Please follow dating outpatient visit.  Family communication: None at bedside  Consults: None    Discharge Instructions     Medication List    TAKE these medications   aspirin 81 MG tablet Take 1 tablet (81 mg total) by mouth at bedtime. What changed:  medication strength  how much to take   cholecalciferol 1000 units tablet Commonly known as:  VITAMIN D Take 1,000 Units by mouth at bedtime.   hydroxypropyl methylcellulose / hypromellose 2.5 % ophthalmic solution Commonly known as:  ISOPTO TEARS / GONIOVISC Place 1 drop into both eyes 3 (three) times daily as needed for dry eyes.   LINZESS 145 MCG Caps capsule Generic drug:  linaclotide Take 145 mcg by mouth daily before breakfast. As needed for constipation   Rivaroxaban 15 & 20 MG Tbpk Take as directed on package: Start with one 15mg  tablet by mouth twice a day with food. On Day 22, switch to one 20mg  tablet once a day with food.      Follow-up Information    Marton Redwood, MD. Schedule an appointment as soon as possible for a visit in 1 week(s).   Specialty:  Internal Medicine Contact information: [address] [place] [address] [phone]  No Known Allergies      Procedures/Studies: Ct Angio Chest Pe W/cm &/or Wo Cm  Result Date: 11/14/2015 CLINICAL DATA:  80 year old male with history of metastatic prostate cancer originally  diagnosed in 1994. Complaining of sharp right upper quadrant abdominal pain since 8 p.m. yesterday evening. Pain worsens with deep inspiration. EXAM: CT ANGIOGRAPHY CHEST CT ABDOMEN AND PELVIS WITH CONTRAST TECHNIQUE: Multidetector CT imaging of the chest was performed using the standard protocol during bolus administration of intravenous contrast. Multiplanar CT image reconstructions and MIPs were obtained to evaluate the vascular anatomy. Multidetector CT imaging of the abdomen and pelvis was performed using the standard protocol during bolus administration of intravenous contrast. CONTRAST:  100 mL of Isovue 370. COMPARISON:  No priors. FINDINGS: CTA CHEST FINDINGS Cardiovascular: There is a small nonobstructive filling defect within the subsegmental sized pulmonary artery branches to the right lower lobe (best appreciated on images 187-197 of series 7). No other central, lobar or segmental sized filling defects are noted to suggest larger pulmonary emboli. Heart size is normal. There is no significant pericardial fluid, thickening or pericardial calcification. There is aortic atherosclerosis, as well as atherosclerosis of the great vessels of the mediastinum and the coronary arteries, including calcified atherosclerotic plaque in the left main, left anterior descending and right coronary arteries. Mediastinum/Nodes: No pathologically enlarged mediastinal or hilar lymph nodes. Esophagus is unremarkable in appearance. Lungs/Pleura: Patchy areas of ground-glass attenuation and interlobular septal thickening are noted the lung bases bilaterally, particularly in the lower lobes, which may suggest sequela of recent mild aspiration. No confluent consolidative airspace disease. Trace right pleural effusion with some mild right pleural thickening. Probable area of subsegmental atelectasis in the medial aspect of the lingula. Musculoskeletal: There are no aggressive appearing lytic or blastic lesions noted in the  visualized portions of the skeleton. Review of the MIP images confirms the above findings. CT ABDOMEN and PELVIS FINDINGS Hepatobiliary: Sub cm low-attenuation lesion in segment 2 of the liver, too small to characterize, but likely a cyst. No other suspicious hepatic lesions are noted. No intra or extrahepatic biliary ductal dilatation. Gallbladder is nearly decompressed, but otherwise unremarkable in appearance. Pancreas: No pancreatic mass. No pancreatic ductal dilatation. No pancreatic or peripancreatic fluid or inflammatory changes. Spleen: Unremarkable. Adrenals/Urinary Tract: 2.8 cm lesion at the junction of upper and interpolar regions of the right kidney is compatible with a simple cyst exophytic simple cyst measuring 1.5 cm in the interpolar region of the left kidney. Several other sub cm low-attenuation lesions in the kidneys bilaterally too small to definitively characterize, but are also favored to represent tiny cysts. No hydroureteronephrosis. Urinary bladder is normal in appearance. Right adrenal gland is normal in appearance. Mild adreniform thickening of the left adrenal gland, likely related to mild left adrenal hyperplasia. Stomach/Bowel: The appearance of the stomach is normal. There is no pathologic dilatation of small bowel or colon. Numerous colonic diverticulae are noted, without surrounding inflammatory changes to suggest an acute diverticulitis at this time. Vascular/Lymphatic: Aortic atherosclerosis, without evidence of aneurysm or dissection in the abdominal or pelvic vasculature. No lymphadenopathy noted in the abdomen or pelvis. Reproductive: Status post radical prostatectomy. Surgical clips along the pelvic side wall from prior lymph node dissection. Other: No significant volume of ascites.  No pneumoperitoneum. Musculoskeletal: Bilateral pars defects at L4 with 17 mm of anterolisthesis of L4 upon L5. Chronic appearing compression fracture of T11 with approximately 50% loss of anterior  vertebral body height. There are no aggressive appearing lytic or blastic  lesions noted in the visualized portions of the skeleton. Review of the MIP images confirms the above findings. IMPRESSION: 1. Small subsegmental sized pulmonary embolism in the right lower lobe. This is of uncertain clinical significance. No larger central, lobar or segmental sized embolus is noted. 2. There are patchy areas of ground-glass attenuation and septal thickening throughout the lung bases bilaterally, suggesting sequela of recent aspiration. 3. Aortic atherosclerosis, in addition to left main and 2 vessel coronary artery disease. Please note that although the presence of coronary artery calcium documents the presence of coronary artery disease, the severity of this disease and any potential stenosis cannot be assessed on this non-gated CT examination. Assessment for potential risk factor modification, dietary therapy or pharmacologic therapy may be warranted, if clinically indicated. 4. Postoperative changes of radical prostatectomy and pelvic lymph node dissection, without findings to suggest local recurrence of disease or definite metastatic disease. 5. Grade 2 spondylolisthesis of L4 upon L5. 6. Additional incidental findings, as above. Electronically Signed   By: Vinnie Langton M.D.   On: 11/14/2015 13:18   Ct Abdomen Pelvis W Contrast  Result Date: 11/14/2015 CLINICAL DATA:  80 year old male with history of metastatic prostate cancer originally diagnosed in 1994. Complaining of sharp right upper quadrant abdominal pain since 8 p.m. yesterday evening. Pain worsens with deep inspiration. EXAM: CT ANGIOGRAPHY CHEST CT ABDOMEN AND PELVIS WITH CONTRAST TECHNIQUE: Multidetector CT imaging of the chest was performed using the standard protocol during bolus administration of intravenous contrast. Multiplanar CT image reconstructions and MIPs were obtained to evaluate the vascular anatomy. Multidetector CT imaging of the abdomen  and pelvis was performed using the standard protocol during bolus administration of intravenous contrast. CONTRAST:  100 mL of Isovue 370. COMPARISON:  No priors. FINDINGS: CTA CHEST FINDINGS Cardiovascular: There is a small nonobstructive filling defect within the subsegmental sized pulmonary artery branches to the right lower lobe (best appreciated on images 187-197 of series 7). No other central, lobar or segmental sized filling defects are noted to suggest larger pulmonary emboli. Heart size is normal. There is no significant pericardial fluid, thickening or pericardial calcification. There is aortic atherosclerosis, as well as atherosclerosis of the great vessels of the mediastinum and the coronary arteries, including calcified atherosclerotic plaque in the left main, left anterior descending and right coronary arteries. Mediastinum/Nodes: No pathologically enlarged mediastinal or hilar lymph nodes. Esophagus is unremarkable in appearance. Lungs/Pleura: Patchy areas of ground-glass attenuation and interlobular septal thickening are noted the lung bases bilaterally, particularly in the lower lobes, which may suggest sequela of recent mild aspiration. No confluent consolidative airspace disease. Trace right pleural effusion with some mild right pleural thickening. Probable area of subsegmental atelectasis in the medial aspect of the lingula. Musculoskeletal: There are no aggressive appearing lytic or blastic lesions noted in the visualized portions of the skeleton. Review of the MIP images confirms the above findings. CT ABDOMEN and PELVIS FINDINGS Hepatobiliary: Sub cm low-attenuation lesion in segment 2 of the liver, too small to characterize, but likely a cyst. No other suspicious hepatic lesions are noted. No intra or extrahepatic biliary ductal dilatation. Gallbladder is nearly decompressed, but otherwise unremarkable in appearance. Pancreas: No pancreatic mass. No pancreatic ductal dilatation. No pancreatic  or peripancreatic fluid or inflammatory changes. Spleen: Unremarkable. Adrenals/Urinary Tract: 2.8 cm lesion at the junction of upper and interpolar regions of the right kidney is compatible with a simple cyst exophytic simple cyst measuring 1.5 cm in the interpolar region of the left kidney. Several other sub  cm low-attenuation lesions in the kidneys bilaterally too small to definitively characterize, but are also favored to represent tiny cysts. No hydroureteronephrosis. Urinary bladder is normal in appearance. Right adrenal gland is normal in appearance. Mild adreniform thickening of the left adrenal gland, likely related to mild left adrenal hyperplasia. Stomach/Bowel: The appearance of the stomach is normal. There is no pathologic dilatation of small bowel or colon. Numerous colonic diverticulae are noted, without surrounding inflammatory changes to suggest an acute diverticulitis at this time. Vascular/Lymphatic: Aortic atherosclerosis, without evidence of aneurysm or dissection in the abdominal or pelvic vasculature. No lymphadenopathy noted in the abdomen or pelvis. Reproductive: Status post radical prostatectomy. Surgical clips along the pelvic side wall from prior lymph node dissection. Other: No significant volume of ascites.  No pneumoperitoneum. Musculoskeletal: Bilateral pars defects at L4 with 17 mm of anterolisthesis of L4 upon L5. Chronic appearing compression fracture of T11 with approximately 50% loss of anterior vertebral body height. There are no aggressive appearing lytic or blastic lesions noted in the visualized portions of the skeleton. Review of the MIP images confirms the above findings. IMPRESSION: 1. Small subsegmental sized pulmonary embolism in the right lower lobe. This is of uncertain clinical significance. No larger central, lobar or segmental sized embolus is noted. 2. There are patchy areas of ground-glass attenuation and septal thickening throughout the lung bases bilaterally,  suggesting sequela of recent aspiration. 3. Aortic atherosclerosis, in addition to left main and 2 vessel coronary artery disease. Please note that although the presence of coronary artery calcium documents the presence of coronary artery disease, the severity of this disease and any potential stenosis cannot be assessed on this non-gated CT examination. Assessment for potential risk factor modification, dietary therapy or pharmacologic therapy may be warranted, if clinically indicated. 4. Postoperative changes of radical prostatectomy and pelvic lymph node dissection, without findings to suggest local recurrence of disease or definite metastatic disease. 5. Grade 2 spondylolisthesis of L4 upon L5. 6. Additional incidental findings, as above. Electronically Signed   By: Vinnie Langton M.D.   On: 11/14/2015 13:18   Dg Chest Portable 1 View  Result Date: 11/14/2015 CLINICAL DATA:  Worsening right upper quadrant pain, some shortness of breath, history of prostate carcinoma EXAM: PORTABLE CHEST 1 VIEW COMPARISON:  None. FINDINGS: The lungs are not optimally aerated with particularly right basilar atelectasis or scarring present. No pneumonia or effusion is seen. Mediastinal and hilar contours are unremarkable. The heart is mildly enlarged. No bony abnormality is seen. IMPRESSION: Suboptimal inspiration with mild right basilar atelectasis or scarring. No definite active process. Electronically Signed   By: Ivar Drape M.D.   On: 11/14/2015 10:03   US Abdomen Limited Ruq  Result Date: 11/14/2015 CLINICAL DATA:  Right upper quadrant pain.  Prostate cancer. EXAM: US ABDOMEN LIMITED - RIGHT UPPER QUADRANT COMPARISON:  CT 07/12/2011. FINDINGS: Gallbladder: No gallstones or wall thickening visualized. No sonographic Murphy sign noted by sonographer. Common bile duct: Diameter: 2.9 mm Liver: Stable 1.1 cm cyst medial portion right lobe of liver. No other focal hepatic abnormality identified. Liver is mildly echogenic.  Fatty infiltration and/or hepatocellular disease cannot be excluded. IMPRESSION: 1.  No evidence of gallstones or biliary distention. 2. Stable 1.1 cm cyst medial portion of the right lobe of the liver. This is unchanged from prior CT of 07/12/2011 and most likely a benign cyst. Liver is slightly echogenic. Fatty infiltration and/or hepatocellular disease cannot be excluded. Electronically Signed   By: Marcello Moores  Register   On:  11/14/2015 11:05    (Echo, Carotid, EGD, Colonoscopy, ERCP)    Subjective:   Discharge Exam: Vitals:   11/14/15 2120 11/15/15 0331  BP: 133/77 133/83  Pulse: 82 80  Resp: 18 18  Temp: 99.1 F (37.3 C) 97.6 F (36.4 C)   Vitals:   11/14/15 1500 11/14/15 1621 11/14/15 2120 11/15/15 0331  BP: 115/75 132/69 133/77 133/83  Pulse: 87 86 82 80  Resp: 20 20 18 18   Temp:  99.8 F (37.7 C) 99.1 F (37.3 C) 97.6 F (36.4 C)  TempSrc:  Oral Oral Oral  SpO2: 93% 98% 93% 98%  Weight:      Height:        General: Elderly obese male not in distress HEENT: No pallor, moist mucosa, supple neck Chest: Clear to auscultation bilaterally, no added sounds Cardiovascular: RRR, S1/S2 +, no rubs, no gallops GI: Soft, nondistended, nontender, bowel sounds present Musculoskeletal: Warm, no edema, no calf swelling or tenderness CNS: Alert and oriented    The results of significant diagnostics from this hospitalization (including imaging, microbiology, ancillary and laboratory) are listed below for reference.     Microbiology: No results found for this or any previous visit (from the past 240 hour(s)).   Labs: BNP (last 3 results) No results for input(s): BNP in the last 8760 hours. Basic Metabolic Panel:  Recent Labs Lab 11/14/15 0943 11/15/15 0506  NA 137 136  K 4.4 3.8  CL 105 105  CO2 26 24  GLUCOSE 123* 127*  BUN 21* 19  CREATININE 0.94 0.81  CALCIUM 8.8* 8.4*   Liver Function Tests:  Recent Labs Lab 11/14/15 0943 11/15/15 0506  AST 15 10*  ALT  12* 9*  ALKPHOS 38 38  BILITOT 1.1 1.1  PROT 6.1* 5.6*  ALBUMIN 3.8 3.1*    Recent Labs Lab 11/14/15 0943  LIPASE 16   No results for input(s): AMMONIA in the last 168 hours. CBC:  Recent Labs Lab 11/14/15 0943 11/15/15 0506  WBC 9.7 6.3  NEUTROABS 8.3*  --   HGB 11.7* 10.7*  HCT 36.5* 33.5*  MCV 95.5 95.7  PLT 125* 109*   Cardiac Enzymes:  Recent Labs Lab 11/14/15 1644 11/14/15 2239  TROPONINI <0.03 <0.03   BNP: Invalid input(s): POCBNP CBG: No results for input(s): GLUCAP in the last 168 hours. D-Dimer No results for input(s): DDIMER in the last 72 hours. Hgb A1c No results for input(s): HGBA1C in the last 72 hours. Lipid Profile No results for input(s): CHOL, HDL, LDLCALC, TRIG, CHOLHDL, LDLDIRECT in the last 72 hours. Thyroid function studies No results for input(s): TSH, T4TOTAL, T3FREE, THYROIDAB in the last 72 hours.  Invalid input(s): FREET3 Anemia work up No results for input(s): VITAMINB12, FOLATE, FERRITIN, TIBC, IRON, RETICCTPCT in the last 72 hours. Urinalysis    Component Value Date/Time   COLORURINE YELLOW 11/14/2015 1240   APPEARANCEUR CLEAR 11/14/2015 1240   LABSPEC 1.018 11/14/2015 1240   PHURINE 7.5 11/14/2015 1240   GLUCOSEU NEGATIVE 11/14/2015 1240   HGBUR NEGATIVE 11/14/2015 1240   BILIRUBINUR NEGATIVE 11/14/2015 1240   KETONESUR NEGATIVE 11/14/2015 1240   PROTEINUR NEGATIVE 11/14/2015 1240   NITRITE NEGATIVE 11/14/2015 1240   LEUKOCYTESUR NEGATIVE 11/14/2015 1240   Sepsis Labs Invalid input(s): PROCALCITONIN,  WBC,  LACTICIDVEN Microbiology No results found for this or any previous visit (from the past 240 hour(s)).   Time coordinating discharge: <30 minutes  SIGNED:   Louellen Molder, MD  [facility] 11/15/2015, 1:52 PM Pager  If 7PM-7AM, please contact night-coverage [email or web address] Password [email or web address]

## 2015-11-15 NOTE — Progress Notes (Addendum)
Co -pay for Xarelto is $30.00, Eliquis is $60.00. If over 30 days pt will nee pre authorization [id number] opt #5.

## 2015-11-16 ENCOUNTER — Other Ambulatory Visit: Payer: Self-pay | Admitting: *Deleted

## 2015-11-16 NOTE — Patient Outreach (Signed)
[facility] Care Management referral received. However, Nicolas Woodward was discharged prior to bedside encounter. Telephone call made to both numbers listed in chart. Left HIPPA compliant voicemail message to request call back. Will await returned call.  Marthenia Rolling, MSN-Ed, RN,BSN [facility] Liaison [phone]

## 2015-11-16 NOTE — Patient Outreach (Signed)
Second attempt to reach Mr. Jobson to discuss [place] Management program due to hospital referral. No answer. Will attempt at later time.    Marthenia Rolling, MSN-Ed, RN,BSN [facility] Liaison [phone]

## 2015-11-23 ENCOUNTER — Ambulatory Visit: Payer: Medicare Other | Admitting: Podiatry

## 2015-11-29 DIAGNOSIS — Z23 Encounter for immunization: Secondary | ICD-10-CM | POA: Diagnosis not present

## 2015-12-15 DIAGNOSIS — D6949 Other primary thrombocytopenia: Secondary | ICD-10-CM | POA: Diagnosis not present

## 2015-12-15 DIAGNOSIS — Z6833 Body mass index (BMI) 33.0-33.9, adult: Secondary | ICD-10-CM | POA: Diagnosis not present

## 2015-12-15 DIAGNOSIS — C61 Malignant neoplasm of prostate: Secondary | ICD-10-CM | POA: Diagnosis not present

## 2015-12-15 DIAGNOSIS — I2699 Other pulmonary embolism without acute cor pulmonale: Secondary | ICD-10-CM | POA: Diagnosis not present

## 2015-12-15 DIAGNOSIS — Z7901 Long term (current) use of anticoagulants: Secondary | ICD-10-CM | POA: Diagnosis not present

## 2015-12-29 DIAGNOSIS — L03116 Cellulitis of left lower limb: Secondary | ICD-10-CM | POA: Diagnosis not present

## 2015-12-29 DIAGNOSIS — M5416 Radiculopathy, lumbar region: Secondary | ICD-10-CM | POA: Diagnosis not present

## 2015-12-29 DIAGNOSIS — Z6833 Body mass index (BMI) 33.0-33.9, adult: Secondary | ICD-10-CM | POA: Diagnosis not present

## 2015-12-29 DIAGNOSIS — C61 Malignant neoplasm of prostate: Secondary | ICD-10-CM | POA: Diagnosis not present

## 2015-12-29 DIAGNOSIS — I2699 Other pulmonary embolism without acute cor pulmonale: Secondary | ICD-10-CM | POA: Diagnosis not present

## 2015-12-29 DIAGNOSIS — B353 Tinea pedis: Secondary | ICD-10-CM | POA: Diagnosis not present

## 2016-01-02 DIAGNOSIS — M4316 Spondylolisthesis, lumbar region: Secondary | ICD-10-CM | POA: Diagnosis not present

## 2016-01-02 DIAGNOSIS — M5136 Other intervertebral disc degeneration, lumbar region: Secondary | ICD-10-CM | POA: Diagnosis not present

## 2016-01-03 DIAGNOSIS — R911 Solitary pulmonary nodule: Secondary | ICD-10-CM | POA: Diagnosis not present

## 2016-01-03 DIAGNOSIS — C61 Malignant neoplasm of prostate: Secondary | ICD-10-CM | POA: Diagnosis not present

## 2016-01-03 DIAGNOSIS — C775 Secondary and unspecified malignant neoplasm of intrapelvic lymph nodes: Secondary | ICD-10-CM | POA: Diagnosis not present

## 2016-01-03 DIAGNOSIS — Z79899 Other long term (current) drug therapy: Secondary | ICD-10-CM | POA: Diagnosis not present

## 2016-01-03 DIAGNOSIS — C7951 Secondary malignant neoplasm of bone: Secondary | ICD-10-CM | POA: Diagnosis not present

## 2016-01-03 DIAGNOSIS — K5900 Constipation, unspecified: Secondary | ICD-10-CM | POA: Diagnosis not present

## 2016-01-03 DIAGNOSIS — R948 Abnormal results of function studies of other organs and systems: Secondary | ICD-10-CM | POA: Diagnosis not present

## 2016-01-03 DIAGNOSIS — Z5181 Encounter for therapeutic drug level monitoring: Secondary | ICD-10-CM | POA: Diagnosis not present

## 2016-02-08 ENCOUNTER — Encounter: Payer: Self-pay | Admitting: Podiatry

## 2016-02-08 ENCOUNTER — Ambulatory Visit (INDEPENDENT_AMBULATORY_CARE_PROVIDER_SITE_OTHER): Payer: Medicare Other | Admitting: Podiatry

## 2016-02-08 VITALS — BP 157/80 | HR 77

## 2016-02-08 DIAGNOSIS — L600 Ingrowing nail: Secondary | ICD-10-CM | POA: Diagnosis not present

## 2016-02-08 DIAGNOSIS — M79671 Pain in right foot: Secondary | ICD-10-CM

## 2016-02-08 DIAGNOSIS — B351 Tinea unguium: Secondary | ICD-10-CM | POA: Diagnosis not present

## 2016-02-08 DIAGNOSIS — M79672 Pain in left foot: Secondary | ICD-10-CM

## 2016-02-08 NOTE — Patient Instructions (Signed)
Seen for hypertrophic nails. All nails debrided. Return in 3 months or as needed.  

## 2016-02-08 NOTE — Progress Notes (Addendum)
SUBJECTIVE: 80 y.o. year old male presents complaining of painful thick and long toe nails.  HPI: Hx of Steroid treatment since February 2017 as part of cancer treatment.  He finished Chemotherapy for Prostate cancer since he was here last in August 22, 2015.  In April 2017 had uneventful recovery following ingrown nail surgery. In May 2017 treated with cortisone injection for Plantar fasciitis on right, stretch exercise for tight Achilles tendon on right.  OBJECTIVE: DERMATOLOGIC EXAMINATION: Thick hypertrophic nails x 10. VASCULAR EXAMINATION OF LOWER LIMBS: Pedal pulses are not palpable both DP and PT bilateral. Edema on foot and ankle. NEUROLOGIC EXAMINATION OF THE LOWER LIMBS: Subjective frequent leg cramp. All epicritic and tactile sensations grossly intact.  MUSCULOSKELETAL EXAMINATION: No change in tight Achilles tendon on right lower limb. Elevated first ray with forefoot varus bilateral.   ASSESSMENT: Onychomycosis x 10. Pain in lower limbs.  PLAN: All nails debrided. Return in 3 months or sooner if needed.

## 2016-02-09 DIAGNOSIS — C61 Malignant neoplasm of prostate: Secondary | ICD-10-CM | POA: Diagnosis not present

## 2016-02-16 DIAGNOSIS — Z79899 Other long term (current) drug therapy: Secondary | ICD-10-CM | POA: Diagnosis not present

## 2016-02-16 DIAGNOSIS — C61 Malignant neoplasm of prostate: Secondary | ICD-10-CM | POA: Diagnosis not present

## 2016-02-16 DIAGNOSIS — C775 Secondary and unspecified malignant neoplasm of intrapelvic lymph nodes: Secondary | ICD-10-CM | POA: Diagnosis not present

## 2016-02-16 DIAGNOSIS — Z5181 Encounter for therapeutic drug level monitoring: Secondary | ICD-10-CM | POA: Diagnosis not present

## 2016-02-16 DIAGNOSIS — C7951 Secondary malignant neoplasm of bone: Secondary | ICD-10-CM | POA: Diagnosis not present

## 2016-04-03 DIAGNOSIS — Z5181 Encounter for therapeutic drug level monitoring: Secondary | ICD-10-CM | POA: Diagnosis not present

## 2016-04-03 DIAGNOSIS — C61 Malignant neoplasm of prostate: Secondary | ICD-10-CM | POA: Diagnosis not present

## 2016-04-03 DIAGNOSIS — C7951 Secondary malignant neoplasm of bone: Secondary | ICD-10-CM | POA: Diagnosis not present

## 2016-04-03 DIAGNOSIS — Z79899 Other long term (current) drug therapy: Secondary | ICD-10-CM | POA: Diagnosis not present

## 2016-04-03 DIAGNOSIS — C775 Secondary and unspecified malignant neoplasm of intrapelvic lymph nodes: Secondary | ICD-10-CM | POA: Diagnosis not present

## 2016-05-08 ENCOUNTER — Ambulatory Visit: Payer: Medicare Other | Admitting: Podiatry

## 2016-05-09 ENCOUNTER — Ambulatory Visit (INDEPENDENT_AMBULATORY_CARE_PROVIDER_SITE_OTHER): Payer: Medicare Other | Admitting: Podiatry

## 2016-05-09 ENCOUNTER — Encounter: Payer: Self-pay | Admitting: Podiatry

## 2016-05-09 DIAGNOSIS — B351 Tinea unguium: Secondary | ICD-10-CM | POA: Diagnosis not present

## 2016-05-09 DIAGNOSIS — M79672 Pain in left foot: Secondary | ICD-10-CM | POA: Diagnosis not present

## 2016-05-09 DIAGNOSIS — M79671 Pain in right foot: Secondary | ICD-10-CM

## 2016-05-09 DIAGNOSIS — L600 Ingrowing nail: Secondary | ICD-10-CM

## 2016-05-09 NOTE — Progress Notes (Signed)
SUBJECTIVE: 81 y.o. year old male presents complaining of painful thick and long toe nails.  Had a hang nail that broke off and bled last week and the area has healed ok.   HPI: Hx of Steroid treatment since February 2017 as part of cancer treatment.  He finished Chemotherapy for Prostate cancer since he was here last in August 22, 2015.  In April 2017 had uneventful recovery following ingrown nail surgery. In May 2017 treated with cortisone injection for Plantar fasciitis on right, stretch exercise for tight Achilles tendon on right.  OBJECTIVE: DERMATOLOGIC EXAMINATION: Thick hypertrophic nails x 10. VASCULAR EXAMINATION OF LOWER LIMBS: Pedal pulses are not palpable both DP and PT bilateral. Edema on foot and ankle. NEUROLOGIC EXAMINATION OF THE LOWER LIMBS: Subjective frequent leg cramp. All epicritic and tactile sensations grossly intact.  MUSCULOSKELETAL EXAMINATION: No change in tight Achilles tendon on right lower limb. Elevated first ray with forefoot varus bilateral.   ASSESSMENT: Onychomycosis x 10. Pain in lower limbs.  PLAN: All nails debrided. Return in 3 months or sooner ifneeded.

## 2016-05-09 NOTE — Patient Instructions (Signed)
Seen for hypertrophic nails. No new changes noted. All nails debrided. Return in 3 months or sooner if needed.

## 2016-07-03 DIAGNOSIS — R911 Solitary pulmonary nodule: Secondary | ICD-10-CM | POA: Diagnosis not present

## 2016-07-03 DIAGNOSIS — Z9079 Acquired absence of other genital organ(s): Secondary | ICD-10-CM | POA: Diagnosis not present

## 2016-07-03 DIAGNOSIS — Z79818 Long term (current) use of other agents affecting estrogen receptors and estrogen levels: Secondary | ICD-10-CM | POA: Diagnosis not present

## 2016-07-03 DIAGNOSIS — C61 Malignant neoplasm of prostate: Secondary | ICD-10-CM | POA: Diagnosis not present

## 2016-07-03 DIAGNOSIS — C775 Secondary and unspecified malignant neoplasm of intrapelvic lymph nodes: Secondary | ICD-10-CM | POA: Diagnosis not present

## 2016-07-03 DIAGNOSIS — Z79899 Other long term (current) drug therapy: Secondary | ICD-10-CM | POA: Diagnosis not present

## 2016-07-03 DIAGNOSIS — C7951 Secondary malignant neoplasm of bone: Secondary | ICD-10-CM | POA: Diagnosis not present

## 2016-07-12 DIAGNOSIS — C61 Malignant neoplasm of prostate: Secondary | ICD-10-CM | POA: Diagnosis not present

## 2016-08-08 ENCOUNTER — Encounter: Payer: Self-pay | Admitting: Podiatry

## 2016-08-08 ENCOUNTER — Ambulatory Visit (INDEPENDENT_AMBULATORY_CARE_PROVIDER_SITE_OTHER): Payer: Medicare Other | Admitting: Podiatry

## 2016-08-08 VITALS — BP 116/71 | HR 76

## 2016-08-08 DIAGNOSIS — B351 Tinea unguium: Secondary | ICD-10-CM | POA: Diagnosis not present

## 2016-08-08 DIAGNOSIS — M79671 Pain in right foot: Secondary | ICD-10-CM

## 2016-08-08 DIAGNOSIS — L600 Ingrowing nail: Secondary | ICD-10-CM

## 2016-08-08 DIAGNOSIS — M79672 Pain in left foot: Secondary | ICD-10-CM | POA: Diagnosis not present

## 2016-08-08 NOTE — Progress Notes (Signed)
SUBJECTIVE: 81 y.o. year old male presents complaining of painful thick and long toe nails. Stated that his feet gets very red in hot water.  HPI: Hx of Steroid treatment since February 2017 as part of cancer treatment.  He finished Chemotherapy for Prostate cancer since he was here last in August 22, 2015.  In April 2017 had uneventful recovery following ingrown nail surgery. In May 2017 treated with cortisone injection for Plantar fasciitis on right, stretch exercise for tight Achilles tendon on right.  OBJECTIVE: DERMATOLOGIC EXAMINATION: Thick hypertrophic nails x 10. Ingrown hallucal nails with irregular nail plate both great toes. Small scattered red spots on forefoot dorsum without raised bumps or blisters. VASCULAR EXAMINATION OF LOWER LIMBS: Pedal pulses are not palpable both DP and PT bilateral. Edema on foot and ankle. NEUROLOGIC EXAMINATION OF THE LOWER LIMBS: Subjective frequent leg cramp. All epicritic and tactile sensations grossly intact.  MUSCULOSKELETAL EXAMINATION: No change in tight Achilles tendon on right lower limb. Elevated first ray with forefoot varus bilateral.   ASSESSMENT: Onychomycosis x 10. Pain in lower limbs. PVD.  PLAN: All nails debrided. Return in 40months or sooner ifneeded.

## 2016-08-08 NOTE — Patient Instructions (Signed)
Seen for hypertrophic nails. All nails debrided. Return in 3 months or as needed.  

## 2016-08-22 DIAGNOSIS — E784 Other hyperlipidemia: Secondary | ICD-10-CM | POA: Diagnosis not present

## 2016-08-22 DIAGNOSIS — Z125 Encounter for screening for malignant neoplasm of prostate: Secondary | ICD-10-CM | POA: Diagnosis not present

## 2016-08-22 DIAGNOSIS — M859 Disorder of bone density and structure, unspecified: Secondary | ICD-10-CM | POA: Diagnosis not present

## 2016-08-22 DIAGNOSIS — I10 Essential (primary) hypertension: Secondary | ICD-10-CM | POA: Diagnosis not present

## 2016-08-29 DIAGNOSIS — Z0000 Encounter for general adult medical examination without abnormal findings: Secondary | ICD-10-CM | POA: Diagnosis not present

## 2016-08-29 DIAGNOSIS — I10 Essential (primary) hypertension: Secondary | ICD-10-CM | POA: Diagnosis not present

## 2016-08-29 DIAGNOSIS — Z1212 Encounter for screening for malignant neoplasm of rectum: Secondary | ICD-10-CM | POA: Diagnosis not present

## 2016-08-29 DIAGNOSIS — Z7901 Long term (current) use of anticoagulants: Secondary | ICD-10-CM | POA: Diagnosis not present

## 2016-08-29 DIAGNOSIS — I2699 Other pulmonary embolism without acute cor pulmonale: Secondary | ICD-10-CM | POA: Diagnosis not present

## 2016-08-29 DIAGNOSIS — E784 Other hyperlipidemia: Secondary | ICD-10-CM | POA: Diagnosis not present

## 2016-08-29 DIAGNOSIS — E291 Testicular hypofunction: Secondary | ICD-10-CM | POA: Diagnosis not present

## 2016-08-29 DIAGNOSIS — C61 Malignant neoplasm of prostate: Secondary | ICD-10-CM | POA: Diagnosis not present

## 2016-08-29 DIAGNOSIS — Z6832 Body mass index (BMI) 32.0-32.9, adult: Secondary | ICD-10-CM | POA: Diagnosis not present

## 2016-08-29 DIAGNOSIS — M859 Disorder of bone density and structure, unspecified: Secondary | ICD-10-CM | POA: Diagnosis not present

## 2016-08-29 DIAGNOSIS — Z1389 Encounter for screening for other disorder: Secondary | ICD-10-CM | POA: Diagnosis not present

## 2016-08-29 DIAGNOSIS — B353 Tinea pedis: Secondary | ICD-10-CM | POA: Diagnosis not present

## 2016-08-29 DIAGNOSIS — M5416 Radiculopathy, lumbar region: Secondary | ICD-10-CM | POA: Diagnosis not present

## 2016-09-09 ENCOUNTER — Emergency Department
Admission: EM | Admit: 2016-09-09 | Discharge: 2016-09-09 | Disposition: A | Discharge status: Home / Self Care | Payer: Medicare Other | Attending: Emergency Medicine | Admitting: Emergency Medicine

## 2016-09-09 ENCOUNTER — Encounter: Payer: Self-pay | Admitting: Emergency Medicine

## 2016-09-09 ENCOUNTER — Emergency Department: Payer: Medicare Other

## 2016-09-09 DIAGNOSIS — M545 Low back pain: Secondary | ICD-10-CM | POA: Diagnosis not present

## 2016-09-09 DIAGNOSIS — D2262 Melanocytic nevi of left upper limb, including shoulder: Secondary | ICD-10-CM | POA: Diagnosis not present

## 2016-09-09 DIAGNOSIS — Z7982 Long term (current) use of aspirin: Secondary | ICD-10-CM | POA: Diagnosis not present

## 2016-09-09 DIAGNOSIS — M5432 Sciatica, left side: Secondary | ICD-10-CM

## 2016-09-09 DIAGNOSIS — L812 Freckles: Secondary | ICD-10-CM | POA: Diagnosis not present

## 2016-09-09 DIAGNOSIS — Z85828 Personal history of other malignant neoplasm of skin: Secondary | ICD-10-CM | POA: Diagnosis not present

## 2016-09-09 DIAGNOSIS — D485 Neoplasm of uncertain behavior of skin: Secondary | ICD-10-CM | POA: Diagnosis not present

## 2016-09-09 DIAGNOSIS — M79605 Pain in left leg: Secondary | ICD-10-CM | POA: Diagnosis present

## 2016-09-09 DIAGNOSIS — L570 Actinic keratosis: Secondary | ICD-10-CM | POA: Diagnosis not present

## 2016-09-09 DIAGNOSIS — L923 Foreign body granuloma of the skin and subcutaneous tissue: Secondary | ICD-10-CM | POA: Diagnosis not present

## 2016-09-09 DIAGNOSIS — D0461 Carcinoma in situ of skin of right upper limb, including shoulder: Secondary | ICD-10-CM | POA: Diagnosis not present

## 2016-09-09 DIAGNOSIS — D2261 Melanocytic nevi of right upper limb, including shoulder: Secondary | ICD-10-CM | POA: Diagnosis not present

## 2016-09-09 DIAGNOSIS — B353 Tinea pedis: Secondary | ICD-10-CM | POA: Diagnosis not present

## 2016-09-09 DIAGNOSIS — Z79899 Other long term (current) drug therapy: Secondary | ICD-10-CM | POA: Insufficient documentation

## 2016-09-09 DIAGNOSIS — M5442 Lumbago with sciatica, left side: Secondary | ICD-10-CM | POA: Insufficient documentation

## 2016-09-09 DIAGNOSIS — D692 Other nonthrombocytopenic purpura: Secondary | ICD-10-CM | POA: Diagnosis not present

## 2016-09-09 DIAGNOSIS — L821 Other seborrheic keratosis: Secondary | ICD-10-CM | POA: Diagnosis not present

## 2016-09-09 DIAGNOSIS — D3617 Benign neoplasm of peripheral nerves and autonomic nervous system of trunk, unspecified: Secondary | ICD-10-CM | POA: Diagnosis not present

## 2016-09-09 MED ORDER — IBUPROFEN 600 MG PO TABS
600.0000 mg | ORAL_TABLET | Freq: Once | ORAL | Status: DC
Start: 2016-09-09 — End: 2016-09-09
  Filled 2016-09-09: qty 1

## 2016-09-09 MED ORDER — LOPERAMIDE HCL 2 MG PO CAPS
ORAL_CAPSULE | ORAL | Status: AC
  Filled 2016-09-09: qty 2

## 2016-09-09 MED ORDER — CYCLOBENZAPRINE HCL 10 MG PO TABS: 10.0000 mg | ORAL_TABLET | Freq: Three times a day (TID) | ORAL | 0 refills | Status: DC | PRN

## 2016-09-09 MED ORDER — IBUPROFEN 600 MG PO TABS: 600.0000 mg | ORAL_TABLET | Freq: Three times a day (TID) | ORAL | 0 refills | Status: DC | PRN

## 2016-09-09 NOTE — ED Notes (Signed)
Pt discharged to home.  Family member driving.  Discharge instructions reviewed.  Verbalized understanding.  No questions or concerns at this time.  Teach back verified.  Pt in NAD.  No items left in ED.   

## 2016-09-09 NOTE — ED Notes (Signed)
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

## 2016-09-09 NOTE — ED Triage Notes (Signed)
Pt reports left leg pain that starts at the hip and shoots down the back of his leg for two days. Pt ambulatory in triage.

## 2016-09-09 NOTE — ED Provider Notes (Signed)
[facility] Emergency Department Provider Note ____________________________________________   I have reviewed the triage vital signs and the triage nursing note.  HISTORY  Chief Complaint Leg Pain   Historian Patient  HPI Nicolas Woodward is a 81 y.o. 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 He states he's at the dermatologist's office and was asked to sit up on the exam table and felt a sharp pain that radiated down his leg and his breath away. He states that he's had a couple of episodes like this that were very short-lived when he would take a step, but never caused him any trouble or need an evaluation for it.  States he has a scant about every 3 months as a follow-up for his cancer.  Currently at rest states he does not really having pain, but it's more significant moderate to severe if he is moving. States he has some mild bowel leakage or incontinence after prostate surgery many years ago, but no change or new incontinence noted. No numbness or weakness.  No abdominal pain.    Past Medical History:  Diagnosis Date  . Prostate cancer [facility])     Patient Active Problem List   Diagnosis Date Noted  . Thrombocytopenia ([place]) 11/15/2015  . Pulmonary embolism ([place]) 11/14/2015  . Pleuritic chest pain 11/14/2015  . RUQ abdominal pain 11/14/2015  . History of prostate cancer 11/14/2015  . Plantar fasciitis of right foot 07/05/2015  . Ingrown nail 05/16/2015  . Paronychia of great toe, left 05/16/2015    No past surgical history on file.  Prior to Admission medications   Medication Sig Start Date End Date Taking? Authorizing Provider  predniSONE (DELTASONE) 10 MG tablet Take 10 mg by mouth daily with breakfast.   Yes [provider]  rivaroxaban (XARELTO) 20 MG TABS tablet Take 20 mg by mouth daily with supper.    Yes [provider]  aspirin 81 MG tablet Take 1 tablet (81 mg total) by mouth at bedtime. 11/15/15   Dhungel, Flonnie Overman, MD  cholecalciferol (VITAMIN D) 1000 units tablet Take 1,000 Units by mouth at bedtime.    [provider]  cyclobenzaprine (FLEXERIL) 10 MG tablet Take 1 tablet (10 mg total) by mouth 3 (three) times daily as needed for muscle spasms. 09/09/16   Lisa Roca, MD  hydroxypropyl methylcellulose / hypromellose (ISOPTO TEARS / GONIOVISC) 2.5 % ophthalmic solution Place 1 drop into both eyes 3 (three) times daily as needed for dry eyes.    [provider]  ibuprofen (ADVIL,MOTRIN) 600 MG tablet Take 1 tablet (600 mg total) by mouth every 8 (eight) hours as needed. 09/09/16   Lisa Roca, MD  Linaclotide [facility]) 145 MCG CAPS capsule Take 145 mcg by mouth daily before breakfast. As needed for constipation 01/04/15   [provider]  Rivaroxaban 15 & 20 MG TBPK Take as directed on package: Start with one 15mg  tablet by mouth twice a day with food. On Day 22, switch to one 20mg  tablet once a day with food. 11/15/15   Dhungel, Flonnie Overman, MD    No Known Allergies  No family history on file.  Social History Social History  Substance Use Topics  . Smoking status: Never Smoker  . Smokeless tobacco: Never Used  . Alcohol use No    Review of Systems  Constitutional: Negative  for fever. Eyes: Negative for visual changes. ENT: Negative for sore throat. Cardiovascular: Negative for chest pain. Respiratory: Negative for shortness of breath. Gastrointestinal: Negative for abdominal pain, vomiting and diarrhea. Genitourinary: Negative for dysuria. Musculoskeletal:Positive for back pain. Skin: Negative for rash. Neurological: Negative for headache.  ____________________________________________   PHYSICAL EXAM:  VITAL SIGNS: ED Triage Vitals  Enc Vitals Group     BP 09/09/16 1356 113/73     Pulse Rate 09/09/16 1356 91     Resp 09/09/16 1356  16     Temp 09/09/16 1356 98.9 F (37.2 C)     Temp Source 09/09/16 1356 Oral     SpO2 09/09/16 1356 95 %     Weight 09/09/16 1357 238 lb (108 kg)     Height 09/09/16 1357 6' (1.829 m)     Head Circumference --      Peak Flow --      Pain Score 09/09/16 1407 10     Pain Loc --      Pain Edu? --      Excl. in [place]? --      Constitutional: Alert and oriented. Well appearing and in no distress. HEENT   Head: Normocephalic and atraumatic.      Eyes: Conjunctivae are normal. Pupils equal and round.       Ears:         Nose: No congestion/rhinnorhea.   Mouth/Throat: Mucous membranes are moist.   Neck: No stridor. Cardiovascular/Chest: Normal rate, regular rhythm.  No murmurs, rubs, or gallops. Respiratory: Normal respiratory effort without tachypnea nor retractions. Breath sounds are clear and equal bilaterally. No wheezes/rales/rhonchi. Gastrointestinal: Soft. No distention, no guarding, no rebound. Nontender.    Genitourinary/rectal:Deferred Musculoskeletal: Mild low back pain left lower back. Mild tenderness left raise on the left. No joint effusions.  No lower extremity tenderness.  No edema.  No calf tenderness. Neurologic:  Normal speech and language. No gross or focal neurologic deficits are appreciated. Skin:  Skin is warm, dry and intact. No rash noted. Psychiatric: Mood and affect are normal. Speech and behavior are normal. Patient exhibits appropriate insight and judgment.   ____________________________________________  LABS (pertinent positives/negatives)  Labs Reviewed - No data to display  ____________________________________________    EKG I, Lisa Roca, MD, the attending physician have personally viewed and interpreted all ECGs.  None ____________________________________________  RADIOLOGY All Xrays were viewed by me. Imaging interpreted by Radiologist.  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  Chronic compression fracture T11, negative for acute fracture. __________________________________________  PROCEDURES  Procedure(s) performed: None  Critical Care performed: None  ____________________________________________   ED COURSE / ASSESSMENT AND PLAN  Pertinent labs & imaging results that were available during my care of the patient were reviewed by me and considered in my medical decision making (see chart for details).   Patient does not report any significant trauma, but states that he's had some episodes of left low back pain extending down the leg over the past couple weeks, but much more significant today when he tried to get up on the dermatology exam table.  Currently at rest no significant pain. He did agree to take an anti-inflammatory because it hurts when he moves around.  No symptoms at this time concerning for cauda equina.  I reviewed the patient's most recent CT scans and PET scan in care everywhere from May, which was negative for any sign of  metastatic osseous or otherwise disease.  Given his age, I will go ahead and obtain x-rays to make sure there is no clear compression fracture. Symptoms seem most clinically consistent with sciatica.  Patient later tells me that he has had spinal injections for several years now where he receives cortisone and gets relief from anywhere between 74 and 18 months. His last injection was in the fall of last year.   X-rays are negative for acute findings. I will discharge him home with recommendation for conservative management and follow up with his primary care doctor, as well as his spine doctor.    CONSULTATIONS:  None   Patient / Family / Caregiver informed of clinical course, medical decision-making process, and agree with plan.   I discussed return precautions, follow-up instructions, and discharge instructions with patient and/or family.  Discharge  Instructions : You were evaluated for low back pain, and your symptoms are consistent with sciatica which is a pinched nerve.  Return to the emergency room immediately for any new or worsening or uncontrolled pain, any weakness, numbness, skin rash, leg swelling, abdominal pain, black or bloody stools, or incontinence of stool or urine.  ___________________________________________   FINAL CLINICAL IMPRESSION(S) / ED DIAGNOSES   Final diagnoses:  Sciatica of left side              Note: This dictation was prepared with Dragon dictation. Any transcriptional errors that result from this process are unintentional    Lisa Roca, MD 09/09/16 [phone]

## 2016-09-09 NOTE — Discharge Instructions (Signed)
You were evaluated for low back pain, and your symptoms are consistent with sciatica which is a pinched nerve.  Return to the emergency room immediately for any new or worsening or uncontrolled pain, any weakness, numbness, skin rash, leg swelling, abdominal pain, black or bloody stools, or incontinence of stool or urine.

## 2016-09-09 NOTE — ED Notes (Signed)
See triage note presents with pain to left leg  States he has a hx of sciatica and this pain feels the same  Pain started on Friday and pain became worse today

## 2016-09-09 NOTE — ED Notes (Signed)
Pt hooked up to monitors, RN introduced self to patient.  Pt requesting phone to call wife at this time.

## 2016-10-02 DIAGNOSIS — Z86711 Personal history of pulmonary embolism: Secondary | ICD-10-CM | POA: Diagnosis not present

## 2016-10-02 DIAGNOSIS — R918 Other nonspecific abnormal finding of lung field: Secondary | ICD-10-CM | POA: Diagnosis not present

## 2016-10-02 DIAGNOSIS — I158 Other secondary hypertension: Secondary | ICD-10-CM | POA: Diagnosis not present

## 2016-10-02 DIAGNOSIS — R51 Headache: Secondary | ICD-10-CM | POA: Diagnosis not present

## 2016-10-02 DIAGNOSIS — Z8546 Personal history of malignant neoplasm of prostate: Secondary | ICD-10-CM | POA: Diagnosis not present

## 2016-10-02 DIAGNOSIS — I10 Essential (primary) hypertension: Secondary | ICD-10-CM | POA: Diagnosis not present

## 2016-10-02 DIAGNOSIS — I2782 Chronic pulmonary embolism: Secondary | ICD-10-CM | POA: Diagnosis not present

## 2016-10-02 DIAGNOSIS — C775 Secondary and unspecified malignant neoplasm of intrapelvic lymph nodes: Secondary | ICD-10-CM | POA: Diagnosis not present

## 2016-10-02 DIAGNOSIS — Z79899 Other long term (current) drug therapy: Secondary | ICD-10-CM | POA: Diagnosis not present

## 2016-10-02 DIAGNOSIS — T50905A Adverse effect of unspecified drugs, medicaments and biological substances, initial encounter: Secondary | ICD-10-CM | POA: Diagnosis not present

## 2016-10-02 DIAGNOSIS — Z8589 Personal history of malignant neoplasm of other organs and systems: Secondary | ICD-10-CM | POA: Diagnosis not present

## 2016-10-02 DIAGNOSIS — C61 Malignant neoplasm of prostate: Secondary | ICD-10-CM | POA: Diagnosis not present

## 2016-10-02 DIAGNOSIS — Z08 Encounter for follow-up examination after completed treatment for malignant neoplasm: Secondary | ICD-10-CM | POA: Diagnosis not present

## 2016-10-02 DIAGNOSIS — C7951 Secondary malignant neoplasm of bone: Secondary | ICD-10-CM | POA: Diagnosis not present

## 2016-10-02 DIAGNOSIS — R937 Abnormal findings on diagnostic imaging of other parts of musculoskeletal system: Secondary | ICD-10-CM | POA: Diagnosis not present

## 2016-10-02 DIAGNOSIS — Z79818 Long term (current) use of other agents affecting estrogen receptors and estrogen levels: Secondary | ICD-10-CM | POA: Diagnosis not present

## 2016-10-02 DIAGNOSIS — Z85830 Personal history of malignant neoplasm of bone: Secondary | ICD-10-CM | POA: Diagnosis not present

## 2016-10-02 DIAGNOSIS — R9349 Abnormal radiologic findings on diagnostic imaging of other urinary organs: Secondary | ICD-10-CM | POA: Diagnosis not present

## 2016-10-15 DIAGNOSIS — H43812 Vitreous degeneration, left eye: Secondary | ICD-10-CM | POA: Diagnosis not present

## 2016-10-15 DIAGNOSIS — H01021 Squamous blepharitis right upper eyelid: Secondary | ICD-10-CM | POA: Diagnosis not present

## 2016-10-15 DIAGNOSIS — H25813 Combined forms of age-related cataract, bilateral: Secondary | ICD-10-CM | POA: Diagnosis not present

## 2016-10-15 DIAGNOSIS — Z961 Presence of intraocular lens: Secondary | ICD-10-CM | POA: Diagnosis not present

## 2016-10-15 DIAGNOSIS — D3132 Benign neoplasm of left choroid: Secondary | ICD-10-CM | POA: Diagnosis not present

## 2016-10-15 DIAGNOSIS — H01022 Squamous blepharitis right lower eyelid: Secondary | ICD-10-CM | POA: Diagnosis not present

## 2016-10-15 DIAGNOSIS — M5416 Radiculopathy, lumbar region: Secondary | ICD-10-CM | POA: Diagnosis not present

## 2016-10-15 DIAGNOSIS — H01024 Squamous blepharitis left upper eyelid: Secondary | ICD-10-CM | POA: Diagnosis not present

## 2016-10-15 DIAGNOSIS — H01025 Squamous blepharitis left lower eyelid: Secondary | ICD-10-CM | POA: Diagnosis not present

## 2016-10-15 DIAGNOSIS — H02831 Dermatochalasis of right upper eyelid: Secondary | ICD-10-CM | POA: Diagnosis not present

## 2016-10-15 DIAGNOSIS — H02834 Dermatochalasis of left upper eyelid: Secondary | ICD-10-CM | POA: Diagnosis not present

## 2016-10-15 DIAGNOSIS — H11823 Conjunctivochalasis, bilateral: Secondary | ICD-10-CM | POA: Diagnosis not present

## 2016-11-06 ENCOUNTER — Ambulatory Visit (INDEPENDENT_AMBULATORY_CARE_PROVIDER_SITE_OTHER): Payer: Medicare Other | Admitting: Podiatry

## 2016-11-06 ENCOUNTER — Encounter: Payer: Self-pay | Admitting: Podiatry

## 2016-11-06 DIAGNOSIS — B351 Tinea unguium: Secondary | ICD-10-CM

## 2016-11-06 DIAGNOSIS — L600 Ingrowing nail: Secondary | ICD-10-CM | POA: Diagnosis not present

## 2016-11-06 DIAGNOSIS — M79672 Pain in left foot: Secondary | ICD-10-CM

## 2016-11-06 DIAGNOSIS — M79671 Pain in right foot: Secondary | ICD-10-CM | POA: Diagnosis not present

## 2016-11-06 NOTE — Patient Instructions (Signed)
Seen for hypertrophic nails and ingrown nail on left great toe lateral border. All nails and ingrown nails debrided. Return in 3 months or sooner if needed.

## 2016-11-06 NOTE — Progress Notes (Signed)
SUBJECTIVE: 81 y.o. year old male presents complaining of painful nail on left great toe with closed in shoes. He is still on steroid treatment for cancer.   HPI: Hx of Steroid treatment since February 2017 as part of cancer treatment.  He finished Chemotherapy for Prostate cancer since he was here last in August 22, 2015.  In April 2017 had uneventful recovery following ingrown nail surgery. In May 2017 treated with cortisone injection for Plantar fasciitis on right, stretch exercise for tight Achilles tendon on right.  OBJECTIVE: DERMATOLOGIC EXAMINATION: Thick hypertrophic nails x 10. Ingrown hallucal nail lateral with irregular nail plate both great toes. VASCULAR EXAMINATION OF LOWER LIMBS: Pedal pulses are not palpable both DP and PT bilateral. NEUROLOGIC EXAMINATION OF THE LOWER LIMBS: Subjective frequent leg cramp. All epicritic and tactile sensations grossly intact.  MUSCULOSKELETAL EXAMINATION: No change in tight Achilles tendon on right lower limb. Elevated first ray with forefoot varus bilateral.   ASSESSMENT: Onychomycosis x 10. Ingrown nail painful left great toe lateral border. Pain in lower limbs. PVD.  PLAN: Reviewed findings and available options. Patient request trimming nails. All nails debrided. Return in 40months or sooner ifneeded

## 2016-12-01 DIAGNOSIS — Z23 Encounter for immunization: Secondary | ICD-10-CM | POA: Diagnosis not present

## 2017-01-01 DIAGNOSIS — Z79899 Other long term (current) drug therapy: Secondary | ICD-10-CM | POA: Diagnosis not present

## 2017-01-01 DIAGNOSIS — R972 Elevated prostate specific antigen [PSA]: Secondary | ICD-10-CM | POA: Diagnosis not present

## 2017-01-01 DIAGNOSIS — R918 Other nonspecific abnormal finding of lung field: Secondary | ICD-10-CM | POA: Diagnosis not present

## 2017-01-01 DIAGNOSIS — R51 Headache: Secondary | ICD-10-CM | POA: Diagnosis not present

## 2017-01-01 DIAGNOSIS — C61 Malignant neoplasm of prostate: Secondary | ICD-10-CM | POA: Diagnosis not present

## 2017-01-01 DIAGNOSIS — C7951 Secondary malignant neoplasm of bone: Secondary | ICD-10-CM | POA: Diagnosis not present

## 2017-01-01 DIAGNOSIS — I10 Essential (primary) hypertension: Secondary | ICD-10-CM | POA: Diagnosis not present

## 2017-01-01 DIAGNOSIS — I158 Other secondary hypertension: Secondary | ICD-10-CM | POA: Diagnosis not present

## 2017-01-01 DIAGNOSIS — C775 Secondary and unspecified malignant neoplasm of intrapelvic lymph nodes: Secondary | ICD-10-CM | POA: Diagnosis not present

## 2017-01-01 DIAGNOSIS — Z79818 Long term (current) use of other agents affecting estrogen receptors and estrogen levels: Secondary | ICD-10-CM | POA: Diagnosis not present

## 2017-01-01 DIAGNOSIS — R935 Abnormal findings on diagnostic imaging of other abdominal regions, including retroperitoneum: Secondary | ICD-10-CM | POA: Diagnosis not present

## 2017-01-01 DIAGNOSIS — T50905A Adverse effect of unspecified drugs, medicaments and biological substances, initial encounter: Secondary | ICD-10-CM | POA: Diagnosis not present

## 2017-01-01 DIAGNOSIS — Z86711 Personal history of pulmonary embolism: Secondary | ICD-10-CM | POA: Diagnosis not present

## 2017-02-06 ENCOUNTER — Encounter: Payer: Self-pay | Admitting: Podiatry

## 2017-02-06 ENCOUNTER — Ambulatory Visit (INDEPENDENT_AMBULATORY_CARE_PROVIDER_SITE_OTHER): Payer: Medicare Other | Admitting: Podiatry

## 2017-02-06 DIAGNOSIS — B351 Tinea unguium: Secondary | ICD-10-CM | POA: Diagnosis not present

## 2017-02-06 DIAGNOSIS — M79672 Pain in left foot: Secondary | ICD-10-CM

## 2017-02-06 DIAGNOSIS — L600 Ingrowing nail: Secondary | ICD-10-CM

## 2017-02-06 DIAGNOSIS — M79671 Pain in right foot: Secondary | ICD-10-CM

## 2017-02-06 NOTE — Patient Instructions (Signed)
Seen for hypertrophic nails. All nails debrided. Return in 3 months or as needed.  

## 2017-02-06 NOTE — Progress Notes (Signed)
Subjective: 81 y.o. year old male patient presents complaining of painful nails. Patient requests toe nails, corns and calluses trimmed.   HPI: Hx of Steroid treatment since February 2017 as part of cancer treatment.  He finished Chemotherapy for Prostate cancer since he was here last in August 22, 2015.  In April 2017 had uneventful recovery following ingrown nail surgery. In May 2017 treated with cortisone injection for Plantar fasciitis on right, stretch exercise for tight Achilles tendon on right.  Objective: Dermatologic: Thick yellow deformed nails x 10. Ingrown nail wth irregular nail plate right great toe painful. Vascular: Pedal pulses are not palpable DP and PT bilateral. Orthopedic: Elevated first ray with forefoot varus bilateral. Tight Achilles tendon on right lower limb. Neurologic: All epicritic and tactile sensations grossly intact. Frequent leg cramp.  Assessment: Dystrophic mycotic nails x 10. Ingrown right great toe nail with pain. PVD.  Treatment: All mycotic nail debrided.  Return in 3 months or as needed.

## 2017-04-02 DIAGNOSIS — C775 Secondary and unspecified malignant neoplasm of intrapelvic lymph nodes: Secondary | ICD-10-CM | POA: Diagnosis not present

## 2017-04-02 DIAGNOSIS — Z79899 Other long term (current) drug therapy: Secondary | ICD-10-CM | POA: Diagnosis not present

## 2017-04-02 DIAGNOSIS — C61 Malignant neoplasm of prostate: Secondary | ICD-10-CM | POA: Diagnosis not present

## 2017-04-02 DIAGNOSIS — C7951 Secondary malignant neoplasm of bone: Secondary | ICD-10-CM | POA: Diagnosis not present

## 2017-04-02 DIAGNOSIS — Z79818 Long term (current) use of other agents affecting estrogen receptors and estrogen levels: Secondary | ICD-10-CM | POA: Diagnosis not present

## 2017-04-02 DIAGNOSIS — Z86711 Personal history of pulmonary embolism: Secondary | ICD-10-CM | POA: Diagnosis not present

## 2017-04-02 DIAGNOSIS — T50905A Adverse effect of unspecified drugs, medicaments and biological substances, initial encounter: Secondary | ICD-10-CM | POA: Diagnosis not present

## 2017-04-02 DIAGNOSIS — I10 Essential (primary) hypertension: Secondary | ICD-10-CM | POA: Diagnosis not present

## 2017-04-02 DIAGNOSIS — R51 Headache: Secondary | ICD-10-CM | POA: Diagnosis not present

## 2017-04-02 DIAGNOSIS — I158 Other secondary hypertension: Secondary | ICD-10-CM | POA: Diagnosis not present

## 2017-04-02 DIAGNOSIS — I2510 Atherosclerotic heart disease of native coronary artery without angina pectoris: Secondary | ICD-10-CM | POA: Diagnosis not present

## 2017-04-02 DIAGNOSIS — R590 Localized enlarged lymph nodes: Secondary | ICD-10-CM | POA: Diagnosis not present

## 2017-04-02 DIAGNOSIS — C779 Secondary and unspecified malignant neoplasm of lymph node, unspecified: Secondary | ICD-10-CM | POA: Diagnosis not present

## 2017-04-10 DIAGNOSIS — L820 Inflamed seborrheic keratosis: Secondary | ICD-10-CM | POA: Diagnosis not present

## 2017-04-10 DIAGNOSIS — D0439 Carcinoma in situ of skin of other parts of face: Secondary | ICD-10-CM | POA: Diagnosis not present

## 2017-04-10 DIAGNOSIS — D485 Neoplasm of uncertain behavior of skin: Secondary | ICD-10-CM | POA: Diagnosis not present

## 2017-04-10 DIAGNOSIS — Z85828 Personal history of other malignant neoplasm of skin: Secondary | ICD-10-CM | POA: Diagnosis not present

## 2017-04-11 DIAGNOSIS — C61 Malignant neoplasm of prostate: Secondary | ICD-10-CM | POA: Diagnosis not present

## 2017-04-11 DIAGNOSIS — N3946 Mixed incontinence: Secondary | ICD-10-CM | POA: Diagnosis not present

## 2017-04-11 DIAGNOSIS — N320 Bladder-neck obstruction: Secondary | ICD-10-CM | POA: Diagnosis not present

## 2017-04-18 DIAGNOSIS — L821 Other seborrheic keratosis: Secondary | ICD-10-CM | POA: Diagnosis not present

## 2017-04-18 DIAGNOSIS — Z85828 Personal history of other malignant neoplasm of skin: Secondary | ICD-10-CM | POA: Diagnosis not present

## 2017-04-18 DIAGNOSIS — D0439 Carcinoma in situ of skin of other parts of face: Secondary | ICD-10-CM | POA: Diagnosis not present

## 2017-04-23 DIAGNOSIS — Z961 Presence of intraocular lens: Secondary | ICD-10-CM | POA: Diagnosis not present

## 2017-04-23 DIAGNOSIS — H04123 Dry eye syndrome of bilateral lacrimal glands: Secondary | ICD-10-CM | POA: Diagnosis not present

## 2017-04-23 DIAGNOSIS — H02835 Dermatochalasis of left lower eyelid: Secondary | ICD-10-CM | POA: Diagnosis not present

## 2017-04-23 DIAGNOSIS — H02832 Dermatochalasis of right lower eyelid: Secondary | ICD-10-CM | POA: Diagnosis not present

## 2017-04-23 DIAGNOSIS — H04203 Unspecified epiphora, bilateral lacrimal glands: Secondary | ICD-10-CM | POA: Diagnosis not present

## 2017-04-23 DIAGNOSIS — H02834 Dermatochalasis of left upper eyelid: Secondary | ICD-10-CM | POA: Diagnosis not present

## 2017-04-23 DIAGNOSIS — H11823 Conjunctivochalasis, bilateral: Secondary | ICD-10-CM | POA: Diagnosis not present

## 2017-04-23 DIAGNOSIS — H02831 Dermatochalasis of right upper eyelid: Secondary | ICD-10-CM | POA: Diagnosis not present

## 2017-05-02 DIAGNOSIS — H04203 Unspecified epiphora, bilateral lacrimal glands: Secondary | ICD-10-CM | POA: Diagnosis not present

## 2017-05-02 DIAGNOSIS — H02135 Senile ectropion of left lower eyelid: Secondary | ICD-10-CM | POA: Diagnosis not present

## 2017-05-02 DIAGNOSIS — H02132 Senile ectropion of right lower eyelid: Secondary | ICD-10-CM | POA: Diagnosis not present

## 2017-05-08 ENCOUNTER — Ambulatory Visit (INDEPENDENT_AMBULATORY_CARE_PROVIDER_SITE_OTHER): Payer: Medicare Other | Admitting: Podiatry

## 2017-05-08 ENCOUNTER — Encounter: Payer: Self-pay | Admitting: Podiatry

## 2017-05-08 DIAGNOSIS — M79671 Pain in right foot: Secondary | ICD-10-CM

## 2017-05-08 DIAGNOSIS — B351 Tinea unguium: Secondary | ICD-10-CM

## 2017-05-08 DIAGNOSIS — L600 Ingrowing nail: Secondary | ICD-10-CM

## 2017-05-08 DIAGNOSIS — M79672 Pain in left foot: Secondary | ICD-10-CM | POA: Diagnosis not present

## 2017-05-08 DIAGNOSIS — R600 Localized edema: Secondary | ICD-10-CM

## 2017-05-08 NOTE — Progress Notes (Signed)
Subjective: 82 y.o. year old male patient presents complaining of painful nails. Patient requests toe nails trimmed.  On a new cancer medication and feels his feet are swollen today.  HPI: Hx of Steroid treatment since February 2017 as part of cancer treatment.  He finished Chemotherapy for Prostate cancer since he was here last in August 22, 2015.  In April 2017 had uneventful recovery following ingrown nail surgery. In May 2017 treated with cortisone injection for Plantar fasciitis on right, stretch exercise for tight Achilles tendon on right.  Objective: Dermatologic: Thick yellow deformed nails x 10. Ingrown nail left great toe lateral border without drainage. Vascular: Pedal pulses are not palpable. Mild pedal edema bilateral. Orthopedic: Elevated first ray with tight Achilles tendon bilateral.Neurologic: All epicritic and tactile sensations grossly intact.  Assessment: Dystrophic mycotic nails x 10. Ingrown nail left great toe. PVD.  Treatment: All mycotic nails debrided.  Left great toe ingrown nail resected and Betadine dressing applied. Return in 3 months or as needed.

## 2017-05-08 NOTE — Patient Instructions (Signed)
Seen for hypertrophic nails. All nails debrided. Return in 3 months or as needed.  

## 2017-05-13 DIAGNOSIS — I10 Essential (primary) hypertension: Secondary | ICD-10-CM | POA: Diagnosis not present

## 2017-05-13 DIAGNOSIS — R031 Nonspecific low blood-pressure reading: Secondary | ICD-10-CM | POA: Diagnosis not present

## 2017-05-13 DIAGNOSIS — Z79899 Other long term (current) drug therapy: Secondary | ICD-10-CM | POA: Diagnosis not present

## 2017-05-13 DIAGNOSIS — C775 Secondary and unspecified malignant neoplasm of intrapelvic lymph nodes: Secondary | ICD-10-CM | POA: Diagnosis not present

## 2017-05-13 DIAGNOSIS — C61 Malignant neoplasm of prostate: Secondary | ICD-10-CM | POA: Diagnosis not present

## 2017-05-13 DIAGNOSIS — R5381 Other malaise: Secondary | ICD-10-CM | POA: Diagnosis not present

## 2017-05-13 DIAGNOSIS — G893 Neoplasm related pain (acute) (chronic): Secondary | ICD-10-CM | POA: Diagnosis not present

## 2017-05-13 DIAGNOSIS — T50905A Adverse effect of unspecified drugs, medicaments and biological substances, initial encounter: Secondary | ICD-10-CM | POA: Diagnosis not present

## 2017-05-13 DIAGNOSIS — D630 Anemia in neoplastic disease: Secondary | ICD-10-CM | POA: Diagnosis not present

## 2017-05-13 DIAGNOSIS — R112 Nausea with vomiting, unspecified: Secondary | ICD-10-CM | POA: Diagnosis not present

## 2017-05-13 DIAGNOSIS — C7951 Secondary malignant neoplasm of bone: Secondary | ICD-10-CM | POA: Diagnosis not present

## 2017-05-13 DIAGNOSIS — R5383 Other fatigue: Secondary | ICD-10-CM | POA: Diagnosis not present

## 2017-05-13 DIAGNOSIS — R51 Headache: Secondary | ICD-10-CM | POA: Diagnosis not present

## 2017-06-11 DIAGNOSIS — C7951 Secondary malignant neoplasm of bone: Secondary | ICD-10-CM | POA: Diagnosis not present

## 2017-06-11 DIAGNOSIS — C61 Malignant neoplasm of prostate: Secondary | ICD-10-CM | POA: Diagnosis not present

## 2017-06-11 DIAGNOSIS — H04203 Unspecified epiphora, bilateral lacrimal glands: Secondary | ICD-10-CM | POA: Diagnosis not present

## 2017-06-11 DIAGNOSIS — Z79818 Long term (current) use of other agents affecting estrogen receptors and estrogen levels: Secondary | ICD-10-CM | POA: Diagnosis not present

## 2017-06-11 DIAGNOSIS — R112 Nausea with vomiting, unspecified: Secondary | ICD-10-CM | POA: Diagnosis not present

## 2017-06-11 DIAGNOSIS — D630 Anemia in neoplastic disease: Secondary | ICD-10-CM | POA: Diagnosis not present

## 2017-06-11 DIAGNOSIS — Z79899 Other long term (current) drug therapy: Secondary | ICD-10-CM | POA: Diagnosis not present

## 2017-06-11 DIAGNOSIS — T50905A Adverse effect of unspecified drugs, medicaments and biological substances, initial encounter: Secondary | ICD-10-CM | POA: Diagnosis not present

## 2017-06-11 DIAGNOSIS — C775 Secondary and unspecified malignant neoplasm of intrapelvic lymph nodes: Secondary | ICD-10-CM | POA: Diagnosis not present

## 2017-06-11 DIAGNOSIS — D6959 Other secondary thrombocytopenia: Secondary | ICD-10-CM | POA: Diagnosis not present

## 2017-06-11 DIAGNOSIS — G893 Neoplasm related pain (acute) (chronic): Secondary | ICD-10-CM | POA: Diagnosis not present

## 2017-06-11 DIAGNOSIS — H538 Other visual disturbances: Secondary | ICD-10-CM | POA: Diagnosis not present

## 2017-07-14 DIAGNOSIS — E291 Testicular hypofunction: Secondary | ICD-10-CM | POA: Diagnosis not present

## 2017-07-14 DIAGNOSIS — N320 Bladder-neck obstruction: Secondary | ICD-10-CM | POA: Diagnosis not present

## 2017-07-14 DIAGNOSIS — Z7901 Long term (current) use of anticoagulants: Secondary | ICD-10-CM | POA: Diagnosis not present

## 2017-07-14 DIAGNOSIS — C61 Malignant neoplasm of prostate: Secondary | ICD-10-CM | POA: Diagnosis not present

## 2017-07-16 DIAGNOSIS — C61 Malignant neoplasm of prostate: Secondary | ICD-10-CM | POA: Diagnosis not present

## 2017-07-16 DIAGNOSIS — D696 Thrombocytopenia, unspecified: Secondary | ICD-10-CM | POA: Diagnosis not present

## 2017-07-16 DIAGNOSIS — Z86711 Personal history of pulmonary embolism: Secondary | ICD-10-CM | POA: Diagnosis not present

## 2017-07-16 DIAGNOSIS — Z79818 Long term (current) use of other agents affecting estrogen receptors and estrogen levels: Secondary | ICD-10-CM | POA: Diagnosis not present

## 2017-07-16 DIAGNOSIS — Z79899 Other long term (current) drug therapy: Secondary | ICD-10-CM | POA: Diagnosis not present

## 2017-07-16 DIAGNOSIS — R51 Headache: Secondary | ICD-10-CM | POA: Diagnosis not present

## 2017-07-16 DIAGNOSIS — I10 Essential (primary) hypertension: Secondary | ICD-10-CM | POA: Diagnosis not present

## 2017-07-16 DIAGNOSIS — C7951 Secondary malignant neoplasm of bone: Secondary | ICD-10-CM | POA: Diagnosis not present

## 2017-07-16 DIAGNOSIS — C775 Secondary and unspecified malignant neoplasm of intrapelvic lymph nodes: Secondary | ICD-10-CM | POA: Diagnosis not present

## 2017-07-16 DIAGNOSIS — D630 Anemia in neoplastic disease: Secondary | ICD-10-CM | POA: Diagnosis not present

## 2017-08-05 DIAGNOSIS — D225 Melanocytic nevi of trunk: Secondary | ICD-10-CM | POA: Diagnosis not present

## 2017-08-05 DIAGNOSIS — D485 Neoplasm of uncertain behavior of skin: Secondary | ICD-10-CM | POA: Diagnosis not present

## 2017-08-05 DIAGNOSIS — Z85828 Personal history of other malignant neoplasm of skin: Secondary | ICD-10-CM | POA: Diagnosis not present

## 2017-08-11 ENCOUNTER — Ambulatory Visit (INDEPENDENT_AMBULATORY_CARE_PROVIDER_SITE_OTHER): Payer: Medicare Other | Admitting: Podiatry

## 2017-08-11 DIAGNOSIS — L600 Ingrowing nail: Secondary | ICD-10-CM

## 2017-08-11 DIAGNOSIS — L03032 Cellulitis of left toe: Secondary | ICD-10-CM | POA: Diagnosis not present

## 2017-08-11 NOTE — Patient Instructions (Signed)
Ingrown nail surgery was done on left lateral border.. Follow soaking instruction.  Some redness and drainage is expected. Call the office if the area gets feverish with increased redness and drainage. Return in one week.

## 2017-08-13 ENCOUNTER — Encounter: Payer: Self-pay | Admitting: Podiatry

## 2017-08-13 NOTE — Progress Notes (Signed)
Subjective: 82 y.o. year old male patient presents complaining of pain in left great toe nail. Patient request permanent nail surgery.  Objective: Dermatologic: ingrown hallucal nail with erythema left big toe. Painful nail left great toe. Vascular: Pedal pulses are not palpable. Bilateral forefoot edema. Orthopedic: Tight Achilles tendon bilateral. Neurologic: All epicritic and tactile sensations grossly intact.  Assessment: Onychocryptosis left great toe. Paronychia left great toe. Pain foot from ingrown nail.  Treatment: As per request Phenol and Alcohol matrixectomy done on lateral border. Affected left great toe was anesthetized with total 78ml mixture of 50/50 0.5% Marcaine plain and 1% Xylocaine plain. Affected lateral nail border was reflected with a nail elevator and excised with nail nipper. Proximal nail matrix tissue was cauterized with Phenol soaked cotton applicator x 4 and neutralized with Alcohol soaked cotton applicator. The wound was dressed with Amerigel ointment dressing. Home care instructions and supply dispensed.  Return in 1 week for follow up.

## 2017-08-18 ENCOUNTER — Encounter: Payer: Medicare Other | Admitting: Podiatry

## 2017-09-04 DIAGNOSIS — Z125 Encounter for screening for malignant neoplasm of prostate: Secondary | ICD-10-CM | POA: Diagnosis not present

## 2017-09-04 DIAGNOSIS — E7849 Other hyperlipidemia: Secondary | ICD-10-CM | POA: Diagnosis not present

## 2017-09-04 DIAGNOSIS — I10 Essential (primary) hypertension: Secondary | ICD-10-CM | POA: Diagnosis not present

## 2017-09-04 DIAGNOSIS — M859 Disorder of bone density and structure, unspecified: Secondary | ICD-10-CM | POA: Diagnosis not present

## 2017-09-04 DIAGNOSIS — R82998 Other abnormal findings in urine: Secondary | ICD-10-CM | POA: Diagnosis not present

## 2017-09-08 DIAGNOSIS — Z85828 Personal history of other malignant neoplasm of skin: Secondary | ICD-10-CM | POA: Diagnosis not present

## 2017-09-08 DIAGNOSIS — L821 Other seborrheic keratosis: Secondary | ICD-10-CM | POA: Diagnosis not present

## 2017-09-08 DIAGNOSIS — D1801 Hemangioma of skin and subcutaneous tissue: Secondary | ICD-10-CM | POA: Diagnosis not present

## 2017-09-08 DIAGNOSIS — L720 Epidermal cyst: Secondary | ICD-10-CM | POA: Diagnosis not present

## 2017-09-08 DIAGNOSIS — D485 Neoplasm of uncertain behavior of skin: Secondary | ICD-10-CM | POA: Diagnosis not present

## 2017-09-08 DIAGNOSIS — D225 Melanocytic nevi of trunk: Secondary | ICD-10-CM | POA: Diagnosis not present

## 2017-09-08 DIAGNOSIS — L812 Freckles: Secondary | ICD-10-CM | POA: Diagnosis not present

## 2017-09-08 DIAGNOSIS — D692 Other nonthrombocytopenic purpura: Secondary | ICD-10-CM | POA: Diagnosis not present

## 2017-09-08 DIAGNOSIS — L570 Actinic keratosis: Secondary | ICD-10-CM | POA: Diagnosis not present

## 2017-09-12 DIAGNOSIS — Z6833 Body mass index (BMI) 33.0-33.9, adult: Secondary | ICD-10-CM | POA: Diagnosis not present

## 2017-09-12 DIAGNOSIS — E7849 Other hyperlipidemia: Secondary | ICD-10-CM | POA: Diagnosis not present

## 2017-09-12 DIAGNOSIS — C61 Malignant neoplasm of prostate: Secondary | ICD-10-CM | POA: Diagnosis not present

## 2017-09-12 DIAGNOSIS — D72819 Decreased white blood cell count, unspecified: Secondary | ICD-10-CM | POA: Diagnosis not present

## 2017-09-12 DIAGNOSIS — M8580 Other specified disorders of bone density and structure, unspecified site: Secondary | ICD-10-CM | POA: Diagnosis not present

## 2017-09-12 DIAGNOSIS — Z1389 Encounter for screening for other disorder: Secondary | ICD-10-CM | POA: Diagnosis not present

## 2017-09-12 DIAGNOSIS — E668 Other obesity: Secondary | ICD-10-CM | POA: Diagnosis not present

## 2017-09-12 DIAGNOSIS — D696 Thrombocytopenia, unspecified: Secondary | ICD-10-CM | POA: Diagnosis not present

## 2017-09-12 DIAGNOSIS — Z0000 Encounter for general adult medical examination without abnormal findings: Secondary | ICD-10-CM | POA: Diagnosis not present

## 2017-09-12 DIAGNOSIS — I10 Essential (primary) hypertension: Secondary | ICD-10-CM | POA: Diagnosis not present

## 2017-09-12 DIAGNOSIS — K5909 Other constipation: Secondary | ICD-10-CM | POA: Diagnosis not present

## 2017-09-12 DIAGNOSIS — Z86711 Personal history of pulmonary embolism: Secondary | ICD-10-CM | POA: Diagnosis not present

## 2017-09-16 ENCOUNTER — Other Ambulatory Visit: Payer: Self-pay | Admitting: Internal Medicine

## 2017-09-16 DIAGNOSIS — E785 Hyperlipidemia, unspecified: Secondary | ICD-10-CM

## 2017-09-17 DIAGNOSIS — Z1212 Encounter for screening for malignant neoplasm of rectum: Secondary | ICD-10-CM | POA: Diagnosis not present

## 2017-09-19 ENCOUNTER — Ambulatory Visit
Admission: RE | Admit: 2017-09-19 | Discharge: 2017-09-19 | Disposition: A | Discharge status: Home / Self Care | Payer: Medicare Other | Source: Ambulatory Visit | Attending: Internal Medicine | Admitting: Internal Medicine

## 2017-09-19 DIAGNOSIS — E785 Hyperlipidemia, unspecified: Secondary | ICD-10-CM

## 2017-09-19 DIAGNOSIS — I700 Atherosclerosis of aorta: Secondary | ICD-10-CM | POA: Diagnosis not present

## 2017-10-07 DIAGNOSIS — H2513 Age-related nuclear cataract, bilateral: Secondary | ICD-10-CM | POA: Diagnosis not present

## 2017-10-07 DIAGNOSIS — H04123 Dry eye syndrome of bilateral lacrimal glands: Secondary | ICD-10-CM | POA: Diagnosis not present

## 2017-10-07 DIAGNOSIS — H018 Other specified inflammations of eyelid: Secondary | ICD-10-CM | POA: Diagnosis not present

## 2017-10-07 DIAGNOSIS — H02831 Dermatochalasis of right upper eyelid: Secondary | ICD-10-CM | POA: Diagnosis not present

## 2017-10-07 DIAGNOSIS — H02834 Dermatochalasis of left upper eyelid: Secondary | ICD-10-CM | POA: Diagnosis not present

## 2017-10-15 DIAGNOSIS — R590 Localized enlarged lymph nodes: Secondary | ICD-10-CM | POA: Diagnosis not present

## 2017-10-15 DIAGNOSIS — I10 Essential (primary) hypertension: Secondary | ICD-10-CM | POA: Diagnosis not present

## 2017-10-15 DIAGNOSIS — Z79818 Long term (current) use of other agents affecting estrogen receptors and estrogen levels: Secondary | ICD-10-CM | POA: Diagnosis not present

## 2017-10-15 DIAGNOSIS — C61 Malignant neoplasm of prostate: Secondary | ICD-10-CM | POA: Diagnosis not present

## 2017-10-15 DIAGNOSIS — C775 Secondary and unspecified malignant neoplasm of intrapelvic lymph nodes: Secondary | ICD-10-CM | POA: Diagnosis not present

## 2017-10-15 DIAGNOSIS — C779 Secondary and unspecified malignant neoplasm of lymph node, unspecified: Secondary | ICD-10-CM | POA: Diagnosis not present

## 2017-10-15 DIAGNOSIS — R51 Headache: Secondary | ICD-10-CM | POA: Diagnosis not present

## 2017-10-15 DIAGNOSIS — Z86711 Personal history of pulmonary embolism: Secondary | ICD-10-CM | POA: Diagnosis not present

## 2017-10-15 DIAGNOSIS — D6959 Other secondary thrombocytopenia: Secondary | ICD-10-CM | POA: Diagnosis not present

## 2017-10-15 DIAGNOSIS — Z79899 Other long term (current) drug therapy: Secondary | ICD-10-CM | POA: Diagnosis not present

## 2017-10-15 DIAGNOSIS — C7951 Secondary malignant neoplasm of bone: Secondary | ICD-10-CM | POA: Diagnosis not present

## 2017-10-15 DIAGNOSIS — D630 Anemia in neoplastic disease: Secondary | ICD-10-CM | POA: Diagnosis not present

## 2017-12-17 DIAGNOSIS — M1711 Unilateral primary osteoarthritis, right knee: Secondary | ICD-10-CM | POA: Diagnosis not present

## 2017-12-17 DIAGNOSIS — M25561 Pain in right knee: Secondary | ICD-10-CM | POA: Diagnosis not present

## 2017-12-17 DIAGNOSIS — M170 Bilateral primary osteoarthritis of knee: Secondary | ICD-10-CM | POA: Diagnosis not present

## 2017-12-17 DIAGNOSIS — M25562 Pain in left knee: Secondary | ICD-10-CM | POA: Diagnosis not present

## 2017-12-18 DIAGNOSIS — M25562 Pain in left knee: Secondary | ICD-10-CM | POA: Diagnosis not present

## 2017-12-18 DIAGNOSIS — M1712 Unilateral primary osteoarthritis, left knee: Secondary | ICD-10-CM | POA: Diagnosis not present

## 2017-12-23 DIAGNOSIS — M25561 Pain in right knee: Secondary | ICD-10-CM | POA: Diagnosis not present

## 2017-12-23 DIAGNOSIS — M1711 Unilateral primary osteoarthritis, right knee: Secondary | ICD-10-CM | POA: Diagnosis not present

## 2017-12-24 DIAGNOSIS — M1712 Unilateral primary osteoarthritis, left knee: Secondary | ICD-10-CM | POA: Diagnosis not present

## 2017-12-24 DIAGNOSIS — M25562 Pain in left knee: Secondary | ICD-10-CM | POA: Diagnosis not present

## 2017-12-30 DIAGNOSIS — M25561 Pain in right knee: Secondary | ICD-10-CM | POA: Diagnosis not present

## 2017-12-30 DIAGNOSIS — M1711 Unilateral primary osteoarthritis, right knee: Secondary | ICD-10-CM | POA: Diagnosis not present

## 2018-01-01 DIAGNOSIS — M1712 Unilateral primary osteoarthritis, left knee: Secondary | ICD-10-CM | POA: Diagnosis not present

## 2018-01-01 DIAGNOSIS — M25562 Pain in left knee: Secondary | ICD-10-CM | POA: Diagnosis not present

## 2018-01-06 DIAGNOSIS — M25561 Pain in right knee: Secondary | ICD-10-CM | POA: Diagnosis not present

## 2018-01-06 DIAGNOSIS — M1711 Unilateral primary osteoarthritis, right knee: Secondary | ICD-10-CM | POA: Diagnosis not present

## 2018-01-07 DIAGNOSIS — M1712 Unilateral primary osteoarthritis, left knee: Secondary | ICD-10-CM | POA: Diagnosis not present

## 2018-01-07 DIAGNOSIS — M25562 Pain in left knee: Secondary | ICD-10-CM | POA: Diagnosis not present

## 2018-01-12 DIAGNOSIS — N3946 Mixed incontinence: Secondary | ICD-10-CM | POA: Diagnosis not present

## 2018-01-12 DIAGNOSIS — C61 Malignant neoplasm of prostate: Secondary | ICD-10-CM | POA: Diagnosis not present

## 2018-01-12 DIAGNOSIS — N320 Bladder-neck obstruction: Secondary | ICD-10-CM | POA: Diagnosis not present

## 2018-01-13 DIAGNOSIS — M25561 Pain in right knee: Secondary | ICD-10-CM | POA: Diagnosis not present

## 2018-01-13 DIAGNOSIS — M1711 Unilateral primary osteoarthritis, right knee: Secondary | ICD-10-CM | POA: Diagnosis not present

## 2018-01-14 DIAGNOSIS — D6959 Other secondary thrombocytopenia: Secondary | ICD-10-CM | POA: Diagnosis not present

## 2018-01-14 DIAGNOSIS — Z86711 Personal history of pulmonary embolism: Secondary | ICD-10-CM | POA: Diagnosis not present

## 2018-01-14 DIAGNOSIS — I10 Essential (primary) hypertension: Secondary | ICD-10-CM | POA: Diagnosis not present

## 2018-01-14 DIAGNOSIS — Z79818 Long term (current) use of other agents affecting estrogen receptors and estrogen levels: Secondary | ICD-10-CM | POA: Diagnosis not present

## 2018-01-14 DIAGNOSIS — R51 Headache: Secondary | ICD-10-CM | POA: Diagnosis not present

## 2018-01-14 DIAGNOSIS — C61 Malignant neoplasm of prostate: Secondary | ICD-10-CM | POA: Diagnosis not present

## 2018-01-14 DIAGNOSIS — D649 Anemia, unspecified: Secondary | ICD-10-CM | POA: Diagnosis not present

## 2018-01-14 DIAGNOSIS — R911 Solitary pulmonary nodule: Secondary | ICD-10-CM | POA: Diagnosis not present

## 2018-01-14 DIAGNOSIS — C7951 Secondary malignant neoplasm of bone: Secondary | ICD-10-CM | POA: Diagnosis not present

## 2018-01-14 DIAGNOSIS — C775 Secondary and unspecified malignant neoplasm of intrapelvic lymph nodes: Secondary | ICD-10-CM | POA: Diagnosis not present

## 2018-01-14 DIAGNOSIS — Z79899 Other long term (current) drug therapy: Secondary | ICD-10-CM | POA: Diagnosis not present

## 2018-01-15 DIAGNOSIS — M25562 Pain in left knee: Secondary | ICD-10-CM | POA: Diagnosis not present

## 2018-01-15 DIAGNOSIS — M1712 Unilateral primary osteoarthritis, left knee: Secondary | ICD-10-CM | POA: Diagnosis not present

## 2018-04-15 DIAGNOSIS — C775 Secondary and unspecified malignant neoplasm of intrapelvic lymph nodes: Secondary | ICD-10-CM | POA: Diagnosis not present

## 2018-04-15 DIAGNOSIS — D6959 Other secondary thrombocytopenia: Secondary | ICD-10-CM | POA: Diagnosis not present

## 2018-04-15 DIAGNOSIS — C7951 Secondary malignant neoplasm of bone: Secondary | ICD-10-CM | POA: Diagnosis not present

## 2018-04-15 DIAGNOSIS — I10 Essential (primary) hypertension: Secondary | ICD-10-CM | POA: Diagnosis not present

## 2018-04-15 DIAGNOSIS — D630 Anemia in neoplastic disease: Secondary | ICD-10-CM | POA: Diagnosis not present

## 2018-04-15 DIAGNOSIS — Z79818 Long term (current) use of other agents affecting estrogen receptors and estrogen levels: Secondary | ICD-10-CM | POA: Diagnosis not present

## 2018-04-15 DIAGNOSIS — R51 Headache: Secondary | ICD-10-CM | POA: Diagnosis not present

## 2018-04-15 DIAGNOSIS — C61 Malignant neoplasm of prostate: Secondary | ICD-10-CM | POA: Diagnosis not present

## 2018-04-15 DIAGNOSIS — Z86711 Personal history of pulmonary embolism: Secondary | ICD-10-CM | POA: Diagnosis not present

## 2018-04-15 DIAGNOSIS — Z9079 Acquired absence of other genital organ(s): Secondary | ICD-10-CM | POA: Diagnosis not present

## 2018-04-15 DIAGNOSIS — Z79899 Other long term (current) drug therapy: Secondary | ICD-10-CM | POA: Diagnosis not present

## 2018-04-21 DIAGNOSIS — H02834 Dermatochalasis of left upper eyelid: Secondary | ICD-10-CM | POA: Diagnosis not present

## 2018-04-21 DIAGNOSIS — H02132 Senile ectropion of right lower eyelid: Secondary | ICD-10-CM | POA: Diagnosis not present

## 2018-04-21 DIAGNOSIS — H02831 Dermatochalasis of right upper eyelid: Secondary | ICD-10-CM | POA: Diagnosis not present

## 2018-04-21 DIAGNOSIS — H02135 Senile ectropion of left lower eyelid: Secondary | ICD-10-CM | POA: Diagnosis not present

## 2018-04-21 DIAGNOSIS — H57819 Brow ptosis, unspecified: Secondary | ICD-10-CM | POA: Diagnosis not present

## 2018-04-23 DIAGNOSIS — M5416 Radiculopathy, lumbar region: Secondary | ICD-10-CM | POA: Diagnosis not present

## 2018-04-30 DIAGNOSIS — J019 Acute sinusitis, unspecified: Secondary | ICD-10-CM | POA: Diagnosis not present

## 2018-06-30 DIAGNOSIS — H57813 Brow ptosis, bilateral: Secondary | ICD-10-CM | POA: Diagnosis not present

## 2018-06-30 DIAGNOSIS — H02135 Senile ectropion of left lower eyelid: Secondary | ICD-10-CM | POA: Diagnosis not present

## 2018-06-30 DIAGNOSIS — H02831 Dermatochalasis of right upper eyelid: Secondary | ICD-10-CM | POA: Diagnosis not present

## 2018-06-30 DIAGNOSIS — H02031 Senile entropion of right upper eyelid: Secondary | ICD-10-CM | POA: Diagnosis not present

## 2018-06-30 DIAGNOSIS — H02834 Dermatochalasis of left upper eyelid: Secondary | ICD-10-CM | POA: Diagnosis not present

## 2018-06-30 DIAGNOSIS — H02132 Senile ectropion of right lower eyelid: Secondary | ICD-10-CM | POA: Diagnosis not present

## 2018-07-17 DIAGNOSIS — C61 Malignant neoplasm of prostate: Secondary | ICD-10-CM | POA: Diagnosis not present

## 2018-07-21 DIAGNOSIS — H04123 Dry eye syndrome of bilateral lacrimal glands: Secondary | ICD-10-CM | POA: Diagnosis not present

## 2018-07-22 DIAGNOSIS — T50905A Adverse effect of unspecified drugs, medicaments and biological substances, initial encounter: Secondary | ICD-10-CM | POA: Diagnosis not present

## 2018-07-22 DIAGNOSIS — Z79818 Long term (current) use of other agents affecting estrogen receptors and estrogen levels: Secondary | ICD-10-CM | POA: Diagnosis not present

## 2018-07-22 DIAGNOSIS — R51 Headache: Secondary | ICD-10-CM | POA: Diagnosis not present

## 2018-07-22 DIAGNOSIS — Z923 Personal history of irradiation: Secondary | ICD-10-CM | POA: Diagnosis not present

## 2018-07-22 DIAGNOSIS — Z9221 Personal history of antineoplastic chemotherapy: Secondary | ICD-10-CM | POA: Diagnosis not present

## 2018-07-22 DIAGNOSIS — Z79899 Other long term (current) drug therapy: Secondary | ICD-10-CM | POA: Diagnosis not present

## 2018-07-22 DIAGNOSIS — R1031 Right lower quadrant pain: Secondary | ICD-10-CM | POA: Diagnosis not present

## 2018-07-22 DIAGNOSIS — I959 Hypotension, unspecified: Secondary | ICD-10-CM | POA: Diagnosis not present

## 2018-07-22 DIAGNOSIS — D649 Anemia, unspecified: Secondary | ICD-10-CM | POA: Diagnosis not present

## 2018-07-22 DIAGNOSIS — C7951 Secondary malignant neoplasm of bone: Secondary | ICD-10-CM | POA: Diagnosis not present

## 2018-07-22 DIAGNOSIS — D630 Anemia in neoplastic disease: Secondary | ICD-10-CM | POA: Diagnosis not present

## 2018-07-22 DIAGNOSIS — Z86711 Personal history of pulmonary embolism: Secondary | ICD-10-CM | POA: Diagnosis not present

## 2018-07-22 DIAGNOSIS — C61 Malignant neoplasm of prostate: Secondary | ICD-10-CM | POA: Diagnosis not present

## 2018-07-22 DIAGNOSIS — I10 Essential (primary) hypertension: Secondary | ICD-10-CM | POA: Diagnosis not present

## 2018-07-22 DIAGNOSIS — D6959 Other secondary thrombocytopenia: Secondary | ICD-10-CM | POA: Diagnosis not present

## 2018-09-14 DIAGNOSIS — I8312 Varicose veins of left lower extremity with inflammation: Secondary | ICD-10-CM | POA: Diagnosis not present

## 2018-09-14 DIAGNOSIS — D692 Other nonthrombocytopenic purpura: Secondary | ICD-10-CM | POA: Diagnosis not present

## 2018-09-14 DIAGNOSIS — I872 Venous insufficiency (chronic) (peripheral): Secondary | ICD-10-CM | POA: Diagnosis not present

## 2018-09-14 DIAGNOSIS — Z85828 Personal history of other malignant neoplasm of skin: Secondary | ICD-10-CM | POA: Diagnosis not present

## 2018-09-14 DIAGNOSIS — D225 Melanocytic nevi of trunk: Secondary | ICD-10-CM | POA: Diagnosis not present

## 2018-09-14 DIAGNOSIS — L570 Actinic keratosis: Secondary | ICD-10-CM | POA: Diagnosis not present

## 2018-09-14 DIAGNOSIS — I8311 Varicose veins of right lower extremity with inflammation: Secondary | ICD-10-CM | POA: Diagnosis not present

## 2018-09-14 DIAGNOSIS — L821 Other seborrheic keratosis: Secondary | ICD-10-CM | POA: Diagnosis not present

## 2018-09-14 DIAGNOSIS — D1801 Hemangioma of skin and subcutaneous tissue: Secondary | ICD-10-CM | POA: Diagnosis not present

## 2018-09-14 DIAGNOSIS — D3617 Benign neoplasm of peripheral nerves and autonomic nervous system of trunk, unspecified: Secondary | ICD-10-CM | POA: Diagnosis not present

## 2018-10-23 DIAGNOSIS — R51 Headache: Secondary | ICD-10-CM | POA: Diagnosis not present

## 2018-10-23 DIAGNOSIS — D4959 Neoplasm of unspecified behavior of other genitourinary organ: Secondary | ICD-10-CM | POA: Diagnosis not present

## 2018-10-23 DIAGNOSIS — C7951 Secondary malignant neoplasm of bone: Secondary | ICD-10-CM | POA: Diagnosis not present

## 2018-10-23 DIAGNOSIS — Z79899 Other long term (current) drug therapy: Secondary | ICD-10-CM | POA: Diagnosis not present

## 2018-10-23 DIAGNOSIS — C61 Malignant neoplasm of prostate: Secondary | ICD-10-CM | POA: Diagnosis not present

## 2018-10-23 DIAGNOSIS — D630 Anemia in neoplastic disease: Secondary | ICD-10-CM | POA: Diagnosis not present

## 2018-10-23 DIAGNOSIS — Z23 Encounter for immunization: Secondary | ICD-10-CM | POA: Diagnosis not present

## 2018-10-23 DIAGNOSIS — D6959 Other secondary thrombocytopenia: Secondary | ICD-10-CM | POA: Diagnosis not present

## 2018-10-23 DIAGNOSIS — Z79818 Long term (current) use of other agents affecting estrogen receptors and estrogen levels: Secondary | ICD-10-CM | POA: Diagnosis not present

## 2018-10-23 DIAGNOSIS — Z86711 Personal history of pulmonary embolism: Secondary | ICD-10-CM | POA: Diagnosis not present

## 2018-10-23 DIAGNOSIS — I10 Essential (primary) hypertension: Secondary | ICD-10-CM | POA: Diagnosis not present

## 2018-11-06 DIAGNOSIS — Z79818 Long term (current) use of other agents affecting estrogen receptors and estrogen levels: Secondary | ICD-10-CM | POA: Diagnosis not present

## 2018-11-06 DIAGNOSIS — D4959 Neoplasm of unspecified behavior of other genitourinary organ: Secondary | ICD-10-CM | POA: Diagnosis not present

## 2018-11-06 DIAGNOSIS — T50905A Adverse effect of unspecified drugs, medicaments and biological substances, initial encounter: Secondary | ICD-10-CM | POA: Diagnosis not present

## 2018-11-06 DIAGNOSIS — D6481 Anemia due to antineoplastic chemotherapy: Secondary | ICD-10-CM | POA: Diagnosis not present

## 2018-11-06 DIAGNOSIS — T451X5A Adverse effect of antineoplastic and immunosuppressive drugs, initial encounter: Secondary | ICD-10-CM | POA: Diagnosis not present

## 2018-11-06 DIAGNOSIS — D6959 Other secondary thrombocytopenia: Secondary | ICD-10-CM | POA: Diagnosis not present

## 2018-11-06 DIAGNOSIS — D701 Agranulocytosis secondary to cancer chemotherapy: Secondary | ICD-10-CM | POA: Diagnosis not present

## 2018-11-06 DIAGNOSIS — Z79899 Other long term (current) drug therapy: Secondary | ICD-10-CM | POA: Diagnosis not present

## 2018-11-06 DIAGNOSIS — C61 Malignant neoplasm of prostate: Secondary | ICD-10-CM | POA: Diagnosis not present

## 2018-11-06 DIAGNOSIS — C7951 Secondary malignant neoplasm of bone: Secondary | ICD-10-CM | POA: Diagnosis not present

## 2019-01-18 DIAGNOSIS — C61 Malignant neoplasm of prostate: Secondary | ICD-10-CM | POA: Diagnosis not present

## 2019-01-18 DIAGNOSIS — R1900 Intra-abdominal and pelvic swelling, mass and lump, unspecified site: Secondary | ICD-10-CM | POA: Diagnosis not present

## 2019-01-18 DIAGNOSIS — N320 Bladder-neck obstruction: Secondary | ICD-10-CM | POA: Diagnosis not present

## 2019-01-22 DIAGNOSIS — D6959 Other secondary thrombocytopenia: Secondary | ICD-10-CM | POA: Diagnosis not present

## 2019-01-22 DIAGNOSIS — Z23 Encounter for immunization: Secondary | ICD-10-CM | POA: Diagnosis not present

## 2019-01-22 DIAGNOSIS — D6481 Anemia due to antineoplastic chemotherapy: Secondary | ICD-10-CM | POA: Diagnosis not present

## 2019-01-22 DIAGNOSIS — T451X5A Adverse effect of antineoplastic and immunosuppressive drugs, initial encounter: Secondary | ICD-10-CM | POA: Diagnosis not present

## 2019-01-22 DIAGNOSIS — D701 Agranulocytosis secondary to cancer chemotherapy: Secondary | ICD-10-CM | POA: Diagnosis not present

## 2019-01-22 DIAGNOSIS — C7951 Secondary malignant neoplasm of bone: Secondary | ICD-10-CM | POA: Diagnosis not present

## 2019-01-22 DIAGNOSIS — Z79818 Long term (current) use of other agents affecting estrogen receptors and estrogen levels: Secondary | ICD-10-CM | POA: Diagnosis not present

## 2019-01-22 DIAGNOSIS — T50905A Adverse effect of unspecified drugs, medicaments and biological substances, initial encounter: Secondary | ICD-10-CM | POA: Diagnosis not present

## 2019-01-22 DIAGNOSIS — Z79899 Other long term (current) drug therapy: Secondary | ICD-10-CM | POA: Diagnosis not present

## 2019-01-22 DIAGNOSIS — C61 Malignant neoplasm of prostate: Secondary | ICD-10-CM | POA: Diagnosis not present

## 2019-02-19 DIAGNOSIS — C7951 Secondary malignant neoplasm of bone: Secondary | ICD-10-CM | POA: Diagnosis not present

## 2019-02-19 DIAGNOSIS — Z79899 Other long term (current) drug therapy: Secondary | ICD-10-CM | POA: Diagnosis not present

## 2019-02-19 DIAGNOSIS — C61 Malignant neoplasm of prostate: Secondary | ICD-10-CM | POA: Diagnosis not present

## 2019-02-19 DIAGNOSIS — D701 Agranulocytosis secondary to cancer chemotherapy: Secondary | ICD-10-CM | POA: Diagnosis not present

## 2019-02-19 DIAGNOSIS — T451X5A Adverse effect of antineoplastic and immunosuppressive drugs, initial encounter: Secondary | ICD-10-CM | POA: Diagnosis not present

## 2019-02-19 DIAGNOSIS — T50905A Adverse effect of unspecified drugs, medicaments and biological substances, initial encounter: Secondary | ICD-10-CM | POA: Diagnosis not present

## 2019-02-19 DIAGNOSIS — D6959 Other secondary thrombocytopenia: Secondary | ICD-10-CM | POA: Diagnosis not present

## 2019-02-19 DIAGNOSIS — Z79818 Long term (current) use of other agents affecting estrogen receptors and estrogen levels: Secondary | ICD-10-CM | POA: Diagnosis not present

## 2019-02-19 DIAGNOSIS — D6481 Anemia due to antineoplastic chemotherapy: Secondary | ICD-10-CM | POA: Diagnosis not present

## 2019-04-23 DIAGNOSIS — Z79899 Other long term (current) drug therapy: Secondary | ICD-10-CM | POA: Diagnosis not present

## 2019-04-23 DIAGNOSIS — R590 Localized enlarged lymph nodes: Secondary | ICD-10-CM | POA: Diagnosis not present

## 2019-04-23 DIAGNOSIS — D649 Anemia, unspecified: Secondary | ICD-10-CM | POA: Diagnosis not present

## 2019-04-23 DIAGNOSIS — R519 Headache, unspecified: Secondary | ICD-10-CM | POA: Diagnosis not present

## 2019-04-23 DIAGNOSIS — I10 Essential (primary) hypertension: Secondary | ICD-10-CM | POA: Diagnosis not present

## 2019-04-23 DIAGNOSIS — Z79818 Long term (current) use of other agents affecting estrogen receptors and estrogen levels: Secondary | ICD-10-CM | POA: Diagnosis not present

## 2019-04-23 DIAGNOSIS — Z86711 Personal history of pulmonary embolism: Secondary | ICD-10-CM | POA: Diagnosis not present

## 2019-04-23 DIAGNOSIS — D6481 Anemia due to antineoplastic chemotherapy: Secondary | ICD-10-CM | POA: Diagnosis not present

## 2019-04-23 DIAGNOSIS — T50905A Adverse effect of unspecified drugs, medicaments and biological substances, initial encounter: Secondary | ICD-10-CM | POA: Diagnosis not present

## 2019-04-23 DIAGNOSIS — R591 Generalized enlarged lymph nodes: Secondary | ICD-10-CM | POA: Diagnosis not present

## 2019-04-23 DIAGNOSIS — I959 Hypotension, unspecified: Secondary | ICD-10-CM | POA: Diagnosis not present

## 2019-04-23 DIAGNOSIS — C61 Malignant neoplasm of prostate: Secondary | ICD-10-CM | POA: Diagnosis not present

## 2019-04-23 DIAGNOSIS — D6959 Other secondary thrombocytopenia: Secondary | ICD-10-CM | POA: Diagnosis not present

## 2019-04-23 DIAGNOSIS — T451X5A Adverse effect of antineoplastic and immunosuppressive drugs, initial encounter: Secondary | ICD-10-CM | POA: Diagnosis not present

## 2019-04-23 DIAGNOSIS — C7951 Secondary malignant neoplasm of bone: Secondary | ICD-10-CM | POA: Diagnosis not present

## 2019-04-23 DIAGNOSIS — D701 Agranulocytosis secondary to cancer chemotherapy: Secondary | ICD-10-CM | POA: Diagnosis not present

## 2019-04-29 IMAGING — CT CT HEART SCORING
3 series · 14 of 20 positions shown, 16 images · non-contrast
Comparison: Chest CT 11/14/2015 and abdominal CT 07/12/2011

CLINICAL DATA: 82-year-old white male with hyperlipidemia. Family
history of cardiac disease.

EXAM:
CT HEART FOR CALCIUM SCORING
TECHNIQUE: CT heart was performed on a 64 channel system using prospective ECG
gating.
A non-contrast exam for calcium scoring was performed.
Note that this exam targets the heart and the chest was not imaged
in its entirety.

[Series 2: calcium scoring 2.00 qr36 bestdiast 71% · axial · 0.38mm/px · z∈[+1749,+1833]mm · 4 of 70 slices shown]
[im 14/70  vessel]
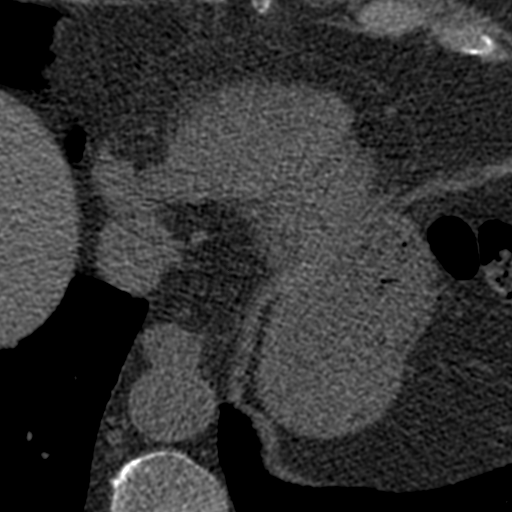
[im 28/70  vessel]
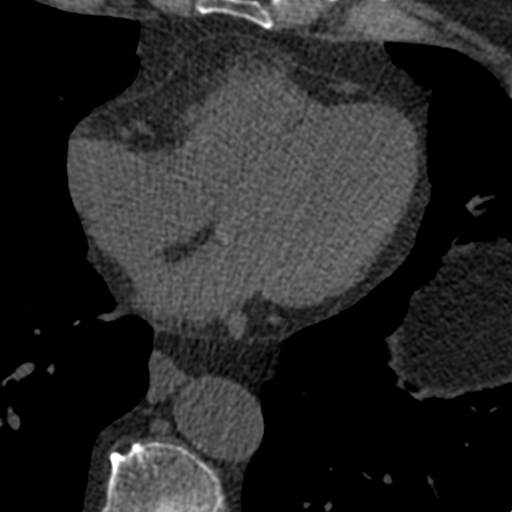
[im 42/70  vessel]
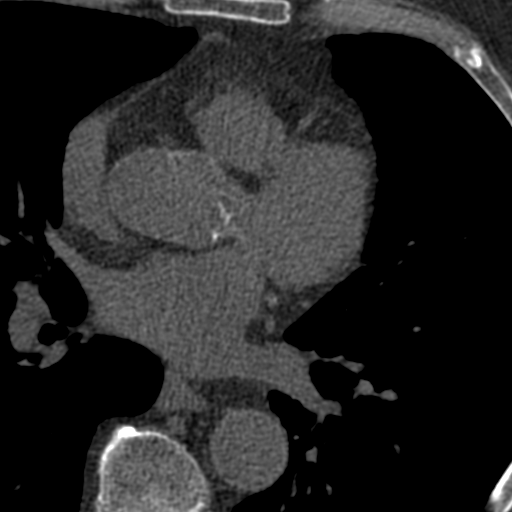
[im 56/70  vessel]
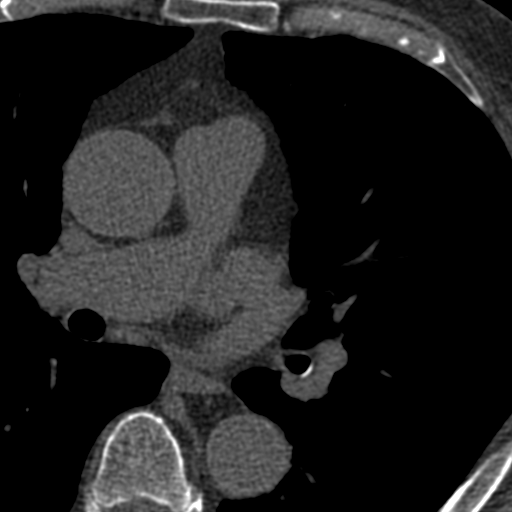

[Series 3: calcium scoring 2.00 br40 bestdiast 71% ax fov · axial · 0.62mm/px · z∈[+1745,+1837]mm · 5 of 70 slices shown, 7 images]
[im 12/70  vessel]
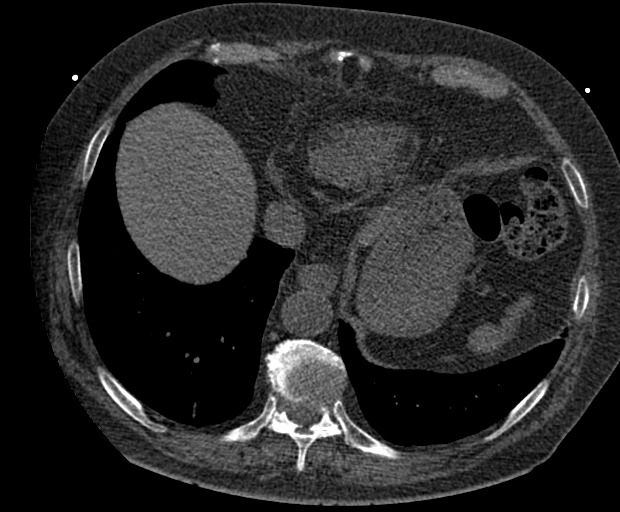
[im 12/70  lung]
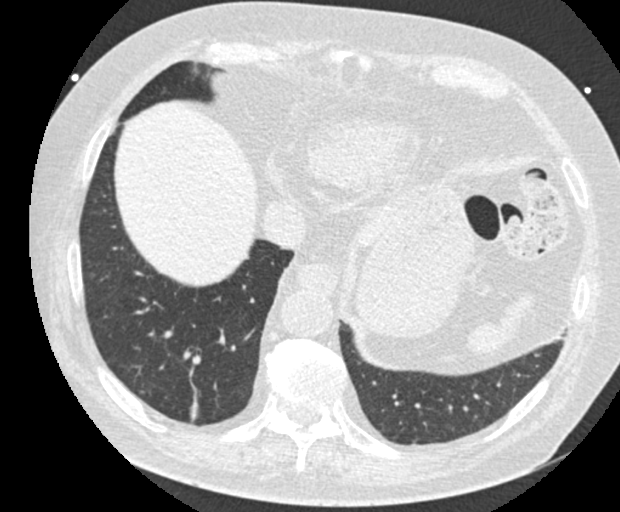
[im 24/70  vessel]
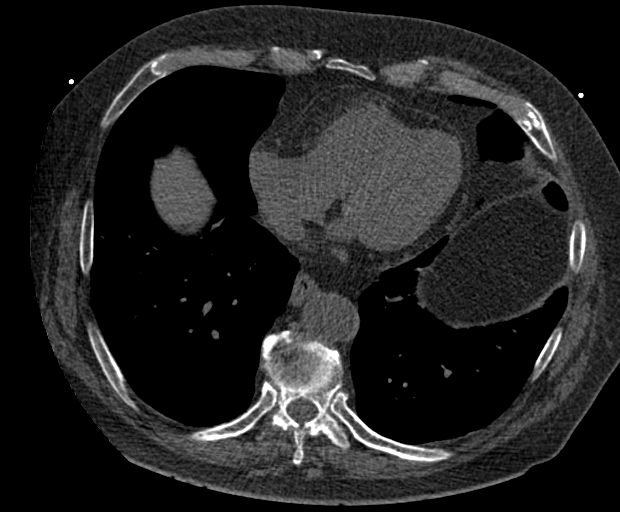
[im 35/70  vessel]
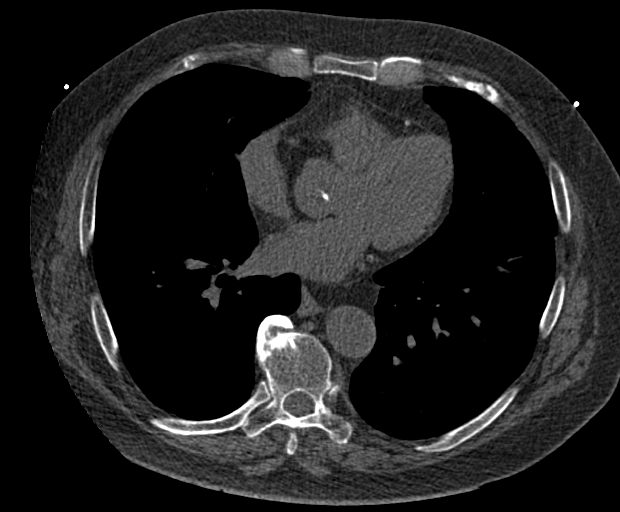
[im 47/70  vessel]
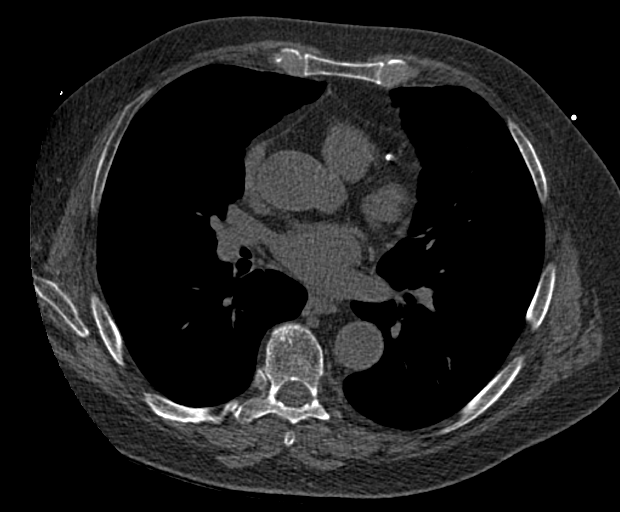
[im 58/70  vessel]
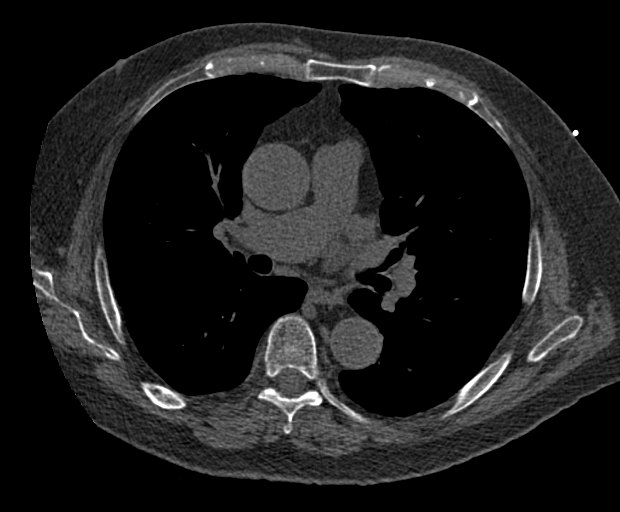
[im 58/70  lung]
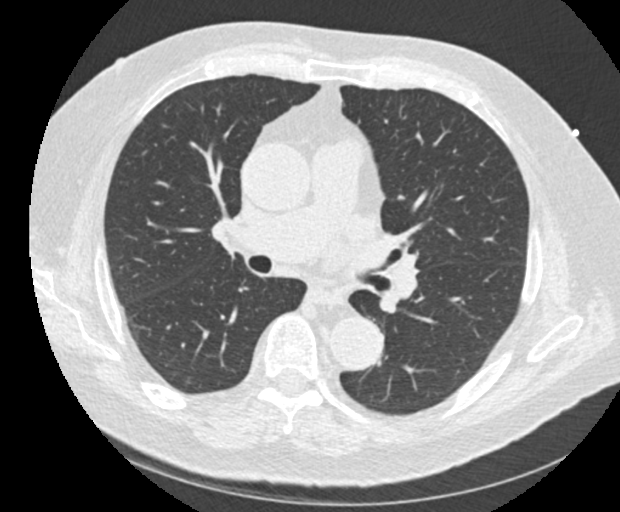

[Series 9: calcium scoring 2.00 br60 bestdiast 71% ax fov · axial · 0.57mm/px · z∈[+1745,+1837]mm · 5 of 70 slices shown]
[im 12/70  vessel]
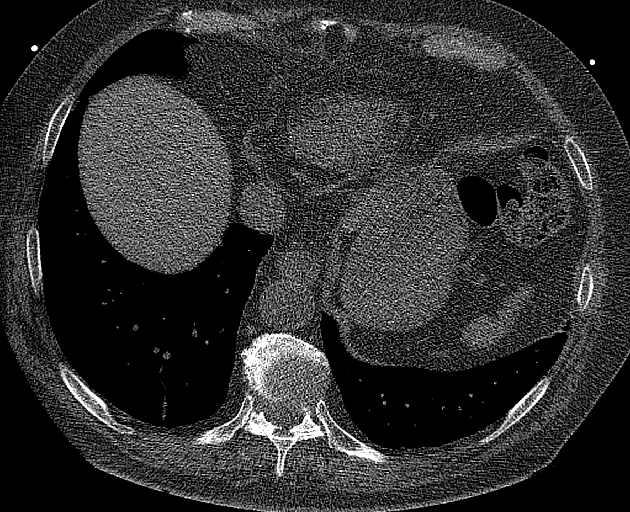
[im 24/70  vessel]
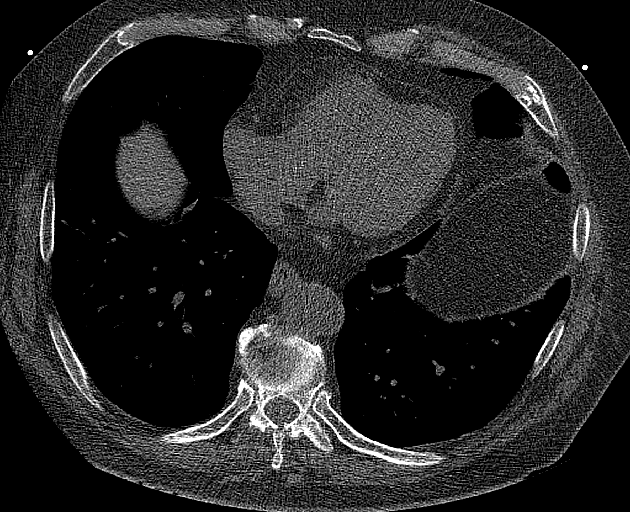
[im 35/70  vessel]
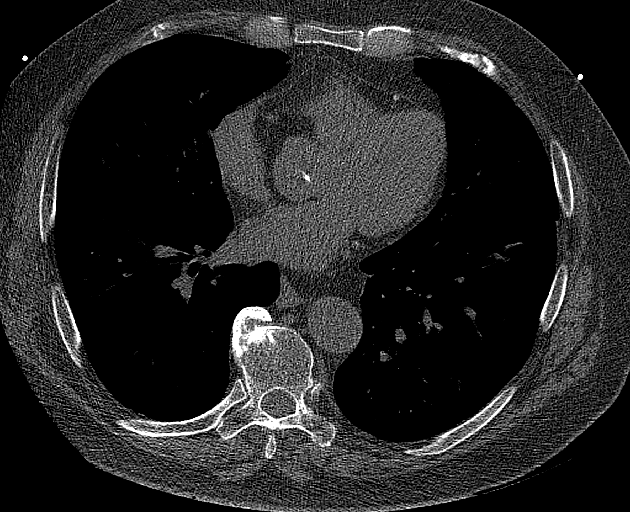
[im 47/70  vessel]
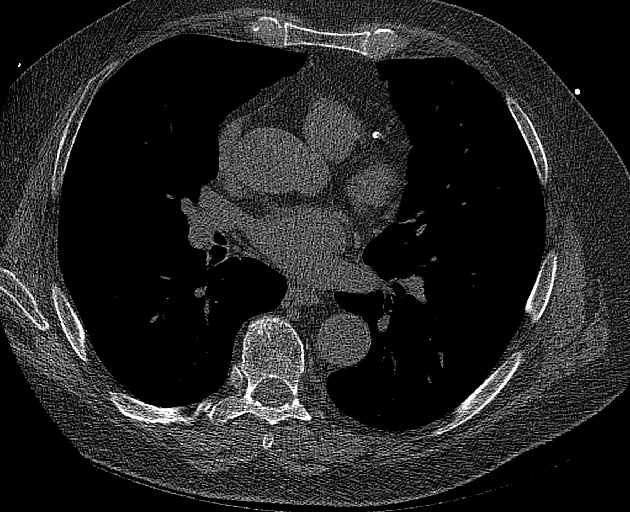
[im 58/70  vessel]
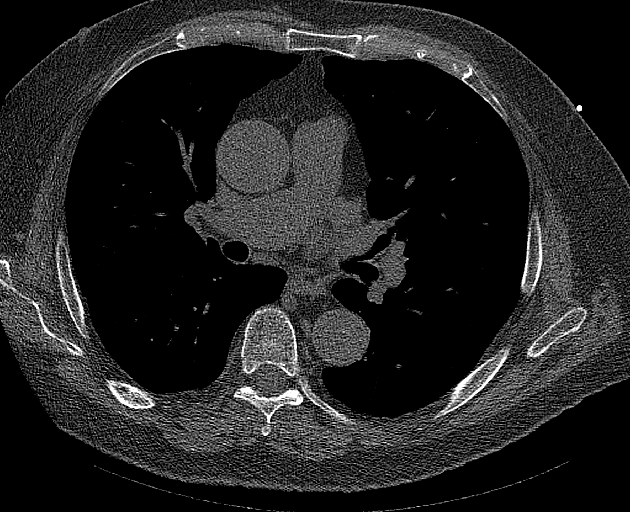

[14 of 20 positions shown; findings below may reference images not displayed]

FINDINGS: Technical quality: Good.

CORONARY CALCIUM

Total Agatston Score: 820

[HOSPITAL] percentile:  66

OTHER FINDINGS:

Cardiovascular: There are scattered coronary artery calcifications
most prominent in the distal left main coronary artery and LAD. Mild
aneurysmal dilatation of the ascending thoracic aorta measuring up
to 4.1 cm and this is stable since 11/14/2015. Heart size is normal
without significant pericardial fluid. Atherosclerotic
calcifications in the abdominal aorta.

Mediastinum/Nodes: Mediastinal structures are unremarkable.
Visualized esophagus is normal.

Lungs/Pleura: There is a flat nodular density along the right minor
fissure on sequence 9, image 23 and not clear if this was present on
the previous chest CT. Nodularity along the anterior right lower
lobe on sequence 9, image 33 measures up to 8 mm and minimally
changed from an abdominal CT on 07/12/2011. There appears to be
scarring along this anterior right lower lobe which may be
accounting for this nodular density. 4 mm nodule in the left lower
lobe on sequence 9, image 3 and not clear if this was present on the
prior chest CT.

Upper Abdomen: Again noted is a low-density structure near the left
hepatic dome and suspect this is related to a cyst.

Musculoskeletal: No acute abnormality.
IMPRESSION: Coronary calcium score is 820. This calcium score is 66 percentile
for subjects of the same age, gender and ethnicity.

Stable aneurysm of the ascending thoracic aorta measuring up to
cm. Recommend annual imaging followup by CTA or MRA. This
recommendation follows 6262
ACCF/AHA/AATS/ACR/ASA/SCA/LEITE/LAA/ROBERTO/BABETTE Guidelines for the
Diagnosis and Management of Patients with Thoracic Aortic Disease.
Circulation. 6262; 121: [id number]

Aortic Atherosclerosis ([id number]-Y2I.I).

Few small nodular lung densities. Focal area of thickening along the
right minor fissure measures roughly 6 mm and probably an incidental
finding but age indeterminate. Recommend attention to this area and
the other nodular densities on the recommended follow-up CTA
imaging.

## 2019-05-28 DIAGNOSIS — M859 Disorder of bone density and structure, unspecified: Secondary | ICD-10-CM | POA: Diagnosis not present

## 2019-05-28 DIAGNOSIS — E7849 Other hyperlipidemia: Secondary | ICD-10-CM | POA: Diagnosis not present

## 2019-05-28 DIAGNOSIS — Z0000 Encounter for general adult medical examination without abnormal findings: Secondary | ICD-10-CM | POA: Diagnosis not present

## 2019-05-28 DIAGNOSIS — Z125 Encounter for screening for malignant neoplasm of prostate: Secondary | ICD-10-CM | POA: Diagnosis not present

## 2019-06-04 DIAGNOSIS — E785 Hyperlipidemia, unspecified: Secondary | ICD-10-CM | POA: Diagnosis not present

## 2019-06-04 DIAGNOSIS — R0789 Other chest pain: Secondary | ICD-10-CM | POA: Diagnosis not present

## 2019-06-04 DIAGNOSIS — R82998 Other abnormal findings in urine: Secondary | ICD-10-CM | POA: Diagnosis not present

## 2019-06-04 DIAGNOSIS — C61 Malignant neoplasm of prostate: Secondary | ICD-10-CM | POA: Diagnosis not present

## 2019-06-04 DIAGNOSIS — Z0000 Encounter for general adult medical examination without abnormal findings: Secondary | ICD-10-CM | POA: Diagnosis not present

## 2019-06-04 DIAGNOSIS — I2510 Atherosclerotic heart disease of native coronary artery without angina pectoris: Secondary | ICD-10-CM | POA: Diagnosis not present

## 2019-06-04 DIAGNOSIS — Z1331 Encounter for screening for depression: Secondary | ICD-10-CM | POA: Diagnosis not present

## 2019-06-04 DIAGNOSIS — I712 Thoracic aortic aneurysm, without rupture: Secondary | ICD-10-CM | POA: Diagnosis not present

## 2019-06-04 DIAGNOSIS — C775 Secondary and unspecified malignant neoplasm of intrapelvic lymph nodes: Secondary | ICD-10-CM | POA: Diagnosis not present

## 2019-06-04 DIAGNOSIS — I700 Atherosclerosis of aorta: Secondary | ICD-10-CM | POA: Diagnosis not present

## 2019-06-04 DIAGNOSIS — R7301 Impaired fasting glucose: Secondary | ICD-10-CM | POA: Diagnosis not present

## 2019-06-04 DIAGNOSIS — I10 Essential (primary) hypertension: Secondary | ICD-10-CM | POA: Diagnosis not present

## 2019-07-29 NOTE — Progress Notes (Signed)
Cardiology Office Note:    Date:  08/02/2019   ID:  Nicolas Woodward, DOB 08/14/1935, MRN [id number]  PCP:  Marton Redwood, MD  Cardiologist:  No primary care provider on file.   Referring MD: Marton Redwood, MD   Chief Complaint  Patient presents with  . Coronary Artery Disease  . Shortness of Breath    History of Present Illness:    Nicolas Woodward is a 84 y.o. male with a hx of asymptomatic coronary artery disease based upon extremely elevated coronary calcium score greater than 800.  Other cardiovascular risk factors include hypertension, hyperlipidemia, and family history of premature coronary atherosclerosis (brother).  Also known to have an ascending aortic aneurysm measured at 4.1 cm 2021.  He has noticed dyspnea on exertion.  The patient is doing relatively well at age 51.  He exercises for a 5 days/week using a stationary bicycle and other equipment at a local gym.  Over the past year he has noted dyspnea on exertion related to getting his mail from the [place].  The trip back to his house from the mailbox is up an incline and he feels he is losing exertional tolerance.  There are no associated other symptoms such as chest discomfort or palpitations.  He denies orthopnea and ankle swelling.  He does not add salt to his diet.  He has a prior history of pulmonary embolism for which he took Xarelto for 6 months and then had it discontinued.  Past Medical History:  Diagnosis Date  . Anemia   . Atherosclerosis   . Back pain   . High coronary artery calcium score   . Hyperlipidemia   . Hypogonadism in male   . Lymphadenopathy   . OA (osteoarthritis)   . Obesity   . Osteopenia   . Prostate cancer ([place])   . 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 Rash, skin    LEG  . Thoracic aortic aneurysm (TAA) ([place])     Past Surgical History:  Procedure Laterality Date  . BELPHAROPTOSIS REPAIR    . BILATERAL KNEE ARTHROSCOPY    . HYDRONEPHROSIS Right   . LYMPHADENOPATHY Right       Current Medications: Current Meds  Medication Sig  . aspirin 81 MG tablet Take 1 tablet (81 mg total) by mouth at bedtime.  . Cholecalciferol 125 MCG (5000 UT) TABS Take 5,000 Units by mouth daily.  . Linaclotide (LINZESS) 145 MCG CAPS capsule Take 145 mcg by mouth daily before breakfast. As needed for constipation  . Multiple Vitamin (MULTIVITAMIN) capsule Take 1 capsule by mouth daily.  Marland Kitchen olaparib (LYNPARZA) 150 MG tablet Take 150 mg by mouth as directed. 2 in the AM; 1 in the PM  . rivaroxaban (XARELTO) 20 MG TABS tablet Take 20 mg by mouth daily with supper.  . rosuvastatin (CRESTOR) 20 MG tablet Take 20 mg by mouth daily.     Allergies:   Patient has no known allergies.   Social History   Socioeconomic History  . Marital status: Married    Spouse name: Not on file  . Number of children: Not on file  . Years of education: Not on file  . Highest education level: Not on file  Occupational History  . Not on file  Tobacco Use  . Smoking status: Never Smoker  . Smokeless tobacco: Never Used  Substance and Sexual Activity  . Alcohol use: No    Alcohol/week: 0.0 standard drinks  . Drug use: Not on  file  . Sexual activity: Not on file  Other Topics Concern  . Not on file  Social History Narrative  . Not on file   Social Determinants of Health   Financial Resource Strain:   . Difficulty of Paying Living Expenses:   Food Insecurity:   . Worried About Charity fundraiser in the Last Year:   . Arboriculturist in the Last Year:   Transportation Needs:   . Film/video editor (Medical):   Marland Kitchen Lack of Transportation (Non-Medical):   Physical Activity:   . Days of Exercise per Week:   . Minutes of Exercise per Session:   Stress:   . Feeling of Stress :   Social Connections:   . Frequency of Communication with Friends and Family:   . Frequency of Social Gatherings with Friends and Family:   . Attends Religious Services:   . Active Member of Clubs or Organizations:    . Attends Archivist Meetings:   Marland Kitchen Marital Status:      Family History: The patient's family history includes Cancer in his mother; Diabetes in his father and paternal grandmother; Healthy in his brother, daughter, and sister; Heart attack in his brother and father; Other in his paternal grandfather; Vascular Disease in his mother.  ROS:   Please see the history of present illness.    He does have bilateral knee discomfort.  Has had prior knee surgery.  He is concerned about his family history where there is a brother who died of MI at 85, father died of MI at 51, and his mother (a previous patient) had aortic stenosis and triple-vessel bypass surgery.  All other systems reviewed and are negative.  EKGs/Labs/Other Studies Reviewed:    The following studies were reviewed today:  CT Coronary Calcium Score 2018 IMPRESSION: Coronary calcium score is 820. This calcium score is 66 percentile for subjects of the same age, gender and ethnicity.  Stable aneurysm of the ascending thoracic aorta measuring up to 4.1 cm. Recommend annual imaging followup by CTA or MRA. This recommendation follows 2010 ACCF/AHA/AATS/ACR/ASA/SCA/SCAI/SIR/STS/SVM Guidelines for the Diagnosis and Management of Patients with Thoracic Aortic Disease. Circulation. 2010; 121: L381-O175  Aortic Atherosclerosis (ICD10-I70.0).   EKG:  EKG normal sinus rhythm, left axis deviation, nonspecific T wave abnormality, and occasional PVC.  Recent Labs: No results found for requested labs within last 8760 hours.  Recent Lipid Panel No results found for: CHOL, TRIG, HDL, CHOLHDL, VLDL, LDLCALC, LDLDIRECT  Physical Exam:    VS:  BP 118/76   Pulse 78   Ht 6' (1.829 m)   Wt 245 lb 6.4 oz (111.3 kg)   SpO2 98%   BMI 33.28 kg/m     Wt Readings from Last 3 Encounters:  08/02/19 245 lb 6.4 oz (111.3 kg)  09/09/16 238 lb (108 kg)  11/14/15 240 lb (108.9 kg)     GEN: Moderate obesity. No acute  distress HEENT: Normal NECK: No JVD. LYMPHATICS: No lymphadenopathy CARDIAC:  RRR with 1/6 systolic murmur but no gallop, or edema. VASCULAR:  Normal Pulses. No bruits. RESPIRATORY:  Clear to auscultation without rales, wheezing or rhonchi  ABDOMEN: Soft, non-tender, non-distended, No pulsatile mass, MUSCULOSKELETAL: No deformity  SKIN: Warm and dry NEUROLOGIC:  Alert and oriented x 3 PSYCHIATRIC:  Normal affect   ASSESSMENT:    1. [place] coronary artery calcium score greater than 400   2. Other acute pulmonary embolism without acute cor pulmonale ([place])   3. Dyspnea on  exertion   4. History of prostate cancer   5. Atypical chest pain   6. Educated about COVID-19 virus infection   7. Systolic murmur   8. Hyperlipidemia LDL goal <70   9. Dilated aortic root (H[facility])    PLAN:    In order of problems listed above:  1. Coronary artery disease with elevated coronary calcium score greater than 400.  Dyspnea on exertion may be anginal equivalent.  Lexiscan myocardial perfusion study is ordered. 2. No longer on Xarelto.  No recurrent pulmonary emboli. 3. He is not really having chest pain.  Dyspnea on exertion is concerning for the possibility of anginal equivalent in view of asymptomatic CAD noted by the elevated calcium score.  The [place] study will help exclude high risk disease. 4. 27-year history of prostate cancer.  He is on long-term testosterone blocking therapy.  Doing well. 5. As mentioned above there is no chest pain although he has had that in the past related to pulmonary emboli. 6. Has received Covid vaccine. 7. The murmur sounds like an aortic sclerosis murmur.  An echocardiogram at some point in the future.  May be done sooner if shortness of breath is related to LV dysfunction when the results of the nuclear study have returned. 8. Continue rosuvastatin.  Most recent LDL was 78. 9. Follow aortic root size.  Perhaps an echo will be done in 1 year if there is reason to  continue to follow.  Overall education and awareness concerning primary/secondary risk prevention was discussed in detail: LDL less than 70, hemoglobin A1c less than 7, blood pressure target less than 130/80 mmHg, >150 minutes of moderate aerobic activity per week, avoidance of smoking, weight control (via diet and exercise), and continued surveillance/management of/for obstructive sleep apnea.    Medication Adjustments/Labs and Tests Ordered: Current medicines are reviewed at length with the patient today.  Concerns regarding medicines are outlined above.  Orders Placed This Encounter  Procedures  . MYOCARDIAL PERFUSION IMAGING  . EKG 12-Lead   No orders of the defined types were placed in this encounter.   Patient Instructions  Medication Instructions:  Your physician recommends that you continue on your current medications as directed. Please refer to the Current Medication list given to you today.  *If you need a refill on your cardiac medications before your next appointment, please call your pharmacy*   Lab Work: None If you have labs (blood work) drawn today and your tests are completely normal, you will receive your results only by: Marland Kitchen MyChart Message (if you have MyChart) OR . A paper copy in the mail If you have any lab test that is abnormal or we need to change your treatment, we will call you to review the results.   Testing/Procedures: Your physician has requested that you have a lexiscan myoview. For further information please visit [email or web address]. Please follow instruction sheet, as given.    Follow-Up: At [facility], you and your health needs are our priority.  As part of our continuing mission to provide you with exceptional heart care, we have created designated Provider Care Teams.  These Care Teams include your primary Cardiologist (physician) and Advanced Practice Providers (APPs -  Physician Assistants and Nurse Practitioners) who all work together  to provide you with the care you need, when you need it.  We recommend signing up for the patient portal called "MyChart".  Sign up information is provided on this After Visit Summary.  MyChart is used to connect with patients  for Virtual Visits (Telemedicine).  Patients are able to view lab/test results, encounter notes, upcoming appointments, etc.  Non-urgent messages can be sent to your provider as well.   To learn more about what you can do with MyChart, go to [email or web address].    Your next appointment:   As needed  The format for your next appointment:   In Person  Provider:   You may see Dr. Daneen Schick or one of the following Advanced Practice Providers on your designated Care Team:    Truitt Merle, NP  Cecilie Kicks, NP  Kathyrn Drown, NP    Other Instructions      Signed, Sinclair Grooms, MD  08/02/2019 10:23 AM    [place]

## 2019-08-02 ENCOUNTER — Ambulatory Visit: Payer: Medicare HMO | Admitting: Interventional Cardiology

## 2019-08-02 ENCOUNTER — Telehealth (HOSPITAL_COMMUNITY): Payer: Self-pay | Admitting: *Deleted

## 2019-08-02 ENCOUNTER — Encounter: Payer: Self-pay | Admitting: Interventional Cardiology

## 2019-08-02 ENCOUNTER — Other Ambulatory Visit: Payer: Self-pay

## 2019-08-02 ENCOUNTER — Encounter: Payer: Self-pay | Admitting: *Deleted

## 2019-08-02 VITALS — BP 118/76 | HR 78 | Ht 72.0 in | Wt 245.4 lb

## 2019-08-02 DIAGNOSIS — R0600 Dyspnea, unspecified: Secondary | ICD-10-CM

## 2019-08-02 DIAGNOSIS — I77810 Thoracic aortic ectasia: Secondary | ICD-10-CM

## 2019-08-02 DIAGNOSIS — Z8546 Personal history of malignant neoplasm of prostate: Secondary | ICD-10-CM

## 2019-08-02 DIAGNOSIS — R0609 Other forms of dyspnea: Secondary | ICD-10-CM

## 2019-08-02 DIAGNOSIS — E785 Hyperlipidemia, unspecified: Secondary | ICD-10-CM | POA: Diagnosis not present

## 2019-08-02 DIAGNOSIS — Z7189 Other specified counseling: Secondary | ICD-10-CM | POA: Diagnosis not present

## 2019-08-02 DIAGNOSIS — R931 Abnormal findings on diagnostic imaging of heart and coronary circulation: Secondary | ICD-10-CM

## 2019-08-02 DIAGNOSIS — I2699 Other pulmonary embolism without acute cor pulmonale: Secondary | ICD-10-CM | POA: Diagnosis not present

## 2019-08-02 DIAGNOSIS — R011 Cardiac murmur, unspecified: Secondary | ICD-10-CM

## 2019-08-02 DIAGNOSIS — R0789 Other chest pain: Secondary | ICD-10-CM | POA: Diagnosis not present

## 2019-08-02 NOTE — Telephone Encounter (Signed)
Left message on voicemail in reference to upcoming appointment scheduled for 08/11/19. Phone number given for a call back so details instructions can be given. Nicolas Woodward

## 2019-08-02 NOTE — Patient Instructions (Signed)
Medication Instructions:  Your physician recommends that you continue on your current medications as directed. Please refer to the Current Medication list given to you today.  *If you need a refill on your cardiac medications before your next appointment, please call your pharmacy*   Lab Work: None If you have labs (blood work) drawn today and your tests are completely normal, you will receive your results only by: . MyChart Message (if you have MyChart) OR . A paper copy in the mail If you have any lab test that is abnormal or we need to change your treatment, we will call you to review the results.   Testing/Procedures: Your physician has requested that you have a lexiscan myoview. For further information please visit [email or web address]. Please follow instruction sheet, as given.    Follow-Up: At [facility], you and your health needs are our priority.  As part of our continuing mission to provide you with exceptional heart care, we have created designated Provider Care Teams.  These Care Teams include your primary Cardiologist (physician) and Advanced Practice Providers (APPs -  Physician Assistants and Nurse Practitioners) who all work together to provide you with the care you need, when you need it.  We recommend signing up for the patient portal called "MyChart".  Sign up information is provided on this After Visit Summary.  MyChart is used to connect with patients for Virtual Visits (Telemedicine).  Patients are able to view lab/test results, encounter notes, upcoming appointments, etc.  Non-urgent messages can be sent to your provider as well.   To learn more about what you can do with MyChart, go to [email or web address].    Your next appointment:   As needed  The format for your next appointment:   In Person  Provider:   You may see Dr. Henry Smith or one of the following Advanced Practice Providers on your designated Care Team:    Lori Gerhardt, NP  Laura Ingold,  NP  Jill McDaniel, NP    Other Instructions    

## 2019-08-05 ENCOUNTER — Telehealth (HOSPITAL_COMMUNITY): Payer: Self-pay | Admitting: *Deleted

## 2019-08-05 NOTE — Telephone Encounter (Signed)
Patient given detailed instructions per Myocardial Perfusion Study Information Sheet for the test on 08/11/19. Patient notified to arrive 15 minutes early and that it is imperative to arrive on time for appointment to keep from having the test rescheduled. ° If you need to cancel or reschedule your appointment, please call the office within 24 hours of your appointment. . Patient verbalized understanding. Nicolas Woodward ° ° ° ° ° °

## 2019-08-11 ENCOUNTER — Ambulatory Visit (HOSPITAL_COMMUNITY): Payer: Medicare HMO | Attending: Internal Medicine

## 2019-08-11 ENCOUNTER — Other Ambulatory Visit: Payer: Self-pay

## 2019-08-11 DIAGNOSIS — R931 Abnormal findings on diagnostic imaging of heart and coronary circulation: Secondary | ICD-10-CM | POA: Diagnosis not present

## 2019-08-11 DIAGNOSIS — R0789 Other chest pain: Secondary | ICD-10-CM | POA: Insufficient documentation

## 2019-08-11 LAB — MYOCARDIAL PERFUSION IMAGING
LV dias vol: 91 mL (ref 62–150)
LV sys vol: 27 mL
Peak HR: 80 {beats}/min
Rest HR: 65 {beats}/min
SDS: 1
SRS: 0
SSS: 1
TID: 1.16

## 2019-08-11 MED ORDER — TECHNETIUM TC 99M TETROFOSMIN IV KIT
10.4000 | PACK | Freq: Once | INTRAVENOUS | Status: AC | PRN
  Administered 2019-08-11: 10.4 via INTRAVENOUS
  Filled 2019-08-11: qty 11

## 2019-08-11 MED ORDER — REGADENOSON 0.4 MG/5ML IV SOLN
0.4000 mg | Freq: Once | INTRAVENOUS | Status: AC
  Administered 2019-08-11: 0.4 mg via INTRAVENOUS

## 2019-08-11 MED ORDER — TECHNETIUM TC 99M TETROFOSMIN IV KIT
31.9000 | PACK | Freq: Once | INTRAVENOUS | Status: AC | PRN
  Administered 2019-08-11: 31.9 via INTRAVENOUS
  Filled 2019-08-11: qty 32

## 2019-08-13 DIAGNOSIS — Z79818 Long term (current) use of other agents affecting estrogen receptors and estrogen levels: Secondary | ICD-10-CM | POA: Diagnosis not present

## 2019-08-13 DIAGNOSIS — C61 Malignant neoplasm of prostate: Secondary | ICD-10-CM | POA: Diagnosis not present

## 2019-08-13 DIAGNOSIS — R519 Headache, unspecified: Secondary | ICD-10-CM | POA: Diagnosis not present

## 2019-08-13 DIAGNOSIS — R590 Localized enlarged lymph nodes: Secondary | ICD-10-CM | POA: Diagnosis not present

## 2019-08-13 DIAGNOSIS — D696 Thrombocytopenia, unspecified: Secondary | ICD-10-CM | POA: Diagnosis not present

## 2019-08-13 DIAGNOSIS — D701 Agranulocytosis secondary to cancer chemotherapy: Secondary | ICD-10-CM | POA: Diagnosis not present

## 2019-08-13 DIAGNOSIS — I10 Essential (primary) hypertension: Secondary | ICD-10-CM | POA: Diagnosis not present

## 2019-08-13 DIAGNOSIS — T451X5A Adverse effect of antineoplastic and immunosuppressive drugs, initial encounter: Secondary | ICD-10-CM | POA: Diagnosis not present

## 2019-08-13 DIAGNOSIS — D630 Anemia in neoplastic disease: Secondary | ICD-10-CM | POA: Diagnosis not present

## 2019-08-13 DIAGNOSIS — D6959 Other secondary thrombocytopenia: Secondary | ICD-10-CM | POA: Diagnosis not present

## 2019-08-13 DIAGNOSIS — T50905A Adverse effect of unspecified drugs, medicaments and biological substances, initial encounter: Secondary | ICD-10-CM | POA: Diagnosis not present

## 2019-08-13 DIAGNOSIS — D6481 Anemia due to antineoplastic chemotherapy: Secondary | ICD-10-CM | POA: Diagnosis not present

## 2019-08-13 DIAGNOSIS — Z79899 Other long term (current) drug therapy: Secondary | ICD-10-CM | POA: Diagnosis not present

## 2019-08-13 DIAGNOSIS — Z86711 Personal history of pulmonary embolism: Secondary | ICD-10-CM | POA: Diagnosis not present

## 2019-08-13 DIAGNOSIS — C7951 Secondary malignant neoplasm of bone: Secondary | ICD-10-CM | POA: Diagnosis not present

## 2019-09-14 DIAGNOSIS — L570 Actinic keratosis: Secondary | ICD-10-CM | POA: Diagnosis not present

## 2019-09-14 DIAGNOSIS — D485 Neoplasm of uncertain behavior of skin: Secondary | ICD-10-CM | POA: Diagnosis not present

## 2019-09-14 DIAGNOSIS — D692 Other nonthrombocytopenic purpura: Secondary | ICD-10-CM | POA: Diagnosis not present

## 2019-09-14 DIAGNOSIS — D1801 Hemangioma of skin and subcutaneous tissue: Secondary | ICD-10-CM | POA: Diagnosis not present

## 2019-09-14 DIAGNOSIS — Z85828 Personal history of other malignant neoplasm of skin: Secondary | ICD-10-CM | POA: Diagnosis not present

## 2019-09-14 DIAGNOSIS — L820 Inflamed seborrheic keratosis: Secondary | ICD-10-CM | POA: Diagnosis not present

## 2019-09-14 DIAGNOSIS — D225 Melanocytic nevi of trunk: Secondary | ICD-10-CM | POA: Diagnosis not present

## 2019-09-14 DIAGNOSIS — L821 Other seborrheic keratosis: Secondary | ICD-10-CM | POA: Diagnosis not present

## 2019-09-14 DIAGNOSIS — D0422 Carcinoma in situ of skin of left ear and external auricular canal: Secondary | ICD-10-CM | POA: Diagnosis not present

## 2019-11-12 DIAGNOSIS — R590 Localized enlarged lymph nodes: Secondary | ICD-10-CM | POA: Diagnosis not present

## 2019-11-12 DIAGNOSIS — I10 Essential (primary) hypertension: Secondary | ICD-10-CM | POA: Diagnosis not present

## 2019-11-12 DIAGNOSIS — Z79818 Long term (current) use of other agents affecting estrogen receptors and estrogen levels: Secondary | ICD-10-CM | POA: Diagnosis not present

## 2019-11-12 DIAGNOSIS — D6959 Other secondary thrombocytopenia: Secondary | ICD-10-CM | POA: Diagnosis not present

## 2019-11-12 DIAGNOSIS — T451X5A Adverse effect of antineoplastic and immunosuppressive drugs, initial encounter: Secondary | ICD-10-CM | POA: Diagnosis not present

## 2019-11-12 DIAGNOSIS — C61 Malignant neoplasm of prostate: Secondary | ICD-10-CM | POA: Diagnosis not present

## 2019-11-12 DIAGNOSIS — D701 Agranulocytosis secondary to cancer chemotherapy: Secondary | ICD-10-CM | POA: Diagnosis not present

## 2019-11-12 DIAGNOSIS — R519 Headache, unspecified: Secondary | ICD-10-CM | POA: Diagnosis not present

## 2019-11-12 DIAGNOSIS — Z23 Encounter for immunization: Secondary | ICD-10-CM | POA: Diagnosis not present

## 2019-11-12 DIAGNOSIS — D6481 Anemia due to antineoplastic chemotherapy: Secondary | ICD-10-CM | POA: Diagnosis not present

## 2019-11-12 DIAGNOSIS — D696 Thrombocytopenia, unspecified: Secondary | ICD-10-CM | POA: Diagnosis not present

## 2019-11-12 DIAGNOSIS — Z79899 Other long term (current) drug therapy: Secondary | ICD-10-CM | POA: Diagnosis not present

## 2019-11-12 DIAGNOSIS — C7951 Secondary malignant neoplasm of bone: Secondary | ICD-10-CM | POA: Diagnosis not present

## 2019-12-10 DIAGNOSIS — T451X5A Adverse effect of antineoplastic and immunosuppressive drugs, initial encounter: Secondary | ICD-10-CM | POA: Diagnosis not present

## 2019-12-10 DIAGNOSIS — D701 Agranulocytosis secondary to cancer chemotherapy: Secondary | ICD-10-CM | POA: Diagnosis not present

## 2019-12-10 DIAGNOSIS — C61 Malignant neoplasm of prostate: Secondary | ICD-10-CM | POA: Diagnosis not present

## 2019-12-10 DIAGNOSIS — Z79818 Long term (current) use of other agents affecting estrogen receptors and estrogen levels: Secondary | ICD-10-CM | POA: Diagnosis not present

## 2019-12-10 DIAGNOSIS — Z23 Encounter for immunization: Secondary | ICD-10-CM | POA: Diagnosis not present

## 2019-12-16 DIAGNOSIS — Z79899 Other long term (current) drug therapy: Secondary | ICD-10-CM | POA: Diagnosis not present

## 2019-12-16 DIAGNOSIS — Z79818 Long term (current) use of other agents affecting estrogen receptors and estrogen levels: Secondary | ICD-10-CM | POA: Diagnosis not present

## 2019-12-16 DIAGNOSIS — M19042 Primary osteoarthritis, left hand: Secondary | ICD-10-CM | POA: Diagnosis not present

## 2019-12-16 DIAGNOSIS — T451X5A Adverse effect of antineoplastic and immunosuppressive drugs, initial encounter: Secondary | ICD-10-CM | POA: Diagnosis not present

## 2019-12-16 DIAGNOSIS — R519 Headache, unspecified: Secondary | ICD-10-CM | POA: Diagnosis not present

## 2019-12-16 DIAGNOSIS — D701 Agranulocytosis secondary to cancer chemotherapy: Secondary | ICD-10-CM | POA: Diagnosis not present

## 2019-12-16 DIAGNOSIS — Y848 Other medical procedures as the cause of abnormal reaction of the patient, or of later complication, without mention of misadventure at the time of the procedure: Secondary | ICD-10-CM | POA: Diagnosis not present

## 2019-12-16 DIAGNOSIS — D6481 Anemia due to antineoplastic chemotherapy: Secondary | ICD-10-CM | POA: Diagnosis not present

## 2019-12-16 DIAGNOSIS — C61 Malignant neoplasm of prostate: Secondary | ICD-10-CM | POA: Diagnosis not present

## 2019-12-16 DIAGNOSIS — T50905A Adverse effect of unspecified drugs, medicaments and biological substances, initial encounter: Secondary | ICD-10-CM | POA: Diagnosis not present

## 2019-12-16 DIAGNOSIS — D6959 Other secondary thrombocytopenia: Secondary | ICD-10-CM | POA: Diagnosis not present

## 2019-12-16 DIAGNOSIS — D649 Anemia, unspecified: Secondary | ICD-10-CM | POA: Diagnosis not present

## 2019-12-16 DIAGNOSIS — I10 Essential (primary) hypertension: Secondary | ICD-10-CM | POA: Diagnosis not present

## 2019-12-16 DIAGNOSIS — M19041 Primary osteoarthritis, right hand: Secondary | ICD-10-CM | POA: Diagnosis not present

## 2019-12-16 DIAGNOSIS — C7951 Secondary malignant neoplasm of bone: Secondary | ICD-10-CM | POA: Diagnosis not present

## 2019-12-20 DIAGNOSIS — R69 Illness, unspecified: Secondary | ICD-10-CM | POA: Diagnosis not present

## 2020-01-25 DIAGNOSIS — E785 Hyperlipidemia, unspecified: Secondary | ICD-10-CM | POA: Diagnosis not present

## 2020-01-25 DIAGNOSIS — M8580 Other specified disorders of bone density and structure, unspecified site: Secondary | ICD-10-CM | POA: Diagnosis not present

## 2020-01-25 DIAGNOSIS — E669 Obesity, unspecified: Secondary | ICD-10-CM | POA: Diagnosis not present

## 2020-01-25 DIAGNOSIS — C7951 Secondary malignant neoplasm of bone: Secondary | ICD-10-CM | POA: Diagnosis not present

## 2020-01-25 DIAGNOSIS — C61 Malignant neoplasm of prostate: Secondary | ICD-10-CM | POA: Diagnosis not present

## 2020-01-25 DIAGNOSIS — D84821 Immunodeficiency due to drugs: Secondary | ICD-10-CM | POA: Diagnosis not present

## 2020-01-25 DIAGNOSIS — K5900 Constipation, unspecified: Secondary | ICD-10-CM | POA: Diagnosis not present

## 2020-01-25 DIAGNOSIS — I2510 Atherosclerotic heart disease of native coronary artery without angina pectoris: Secondary | ICD-10-CM | POA: Diagnosis not present

## 2020-01-25 DIAGNOSIS — N529 Male erectile dysfunction, unspecified: Secondary | ICD-10-CM | POA: Diagnosis not present

## 2020-01-25 DIAGNOSIS — R030 Elevated blood-pressure reading, without diagnosis of hypertension: Secondary | ICD-10-CM | POA: Diagnosis not present

## 2020-02-24 DIAGNOSIS — C61 Malignant neoplasm of prostate: Secondary | ICD-10-CM | POA: Diagnosis not present

## 2020-02-24 DIAGNOSIS — N320 Bladder-neck obstruction: Secondary | ICD-10-CM | POA: Diagnosis not present

## 2020-02-24 DIAGNOSIS — N3946 Mixed incontinence: Secondary | ICD-10-CM | POA: Diagnosis not present

## 2020-03-02 DIAGNOSIS — C61 Malignant neoplasm of prostate: Secondary | ICD-10-CM | POA: Diagnosis not present

## 2020-03-09 DIAGNOSIS — Z7901 Long term (current) use of anticoagulants: Secondary | ICD-10-CM | POA: Diagnosis not present

## 2020-03-09 DIAGNOSIS — Z79818 Long term (current) use of other agents affecting estrogen receptors and estrogen levels: Secondary | ICD-10-CM | POA: Diagnosis not present

## 2020-03-09 DIAGNOSIS — T50905A Adverse effect of unspecified drugs, medicaments and biological substances, initial encounter: Secondary | ICD-10-CM | POA: Diagnosis not present

## 2020-03-09 DIAGNOSIS — D630 Anemia in neoplastic disease: Secondary | ICD-10-CM | POA: Diagnosis not present

## 2020-03-09 DIAGNOSIS — Z86711 Personal history of pulmonary embolism: Secondary | ICD-10-CM | POA: Diagnosis not present

## 2020-03-09 DIAGNOSIS — C7951 Secondary malignant neoplasm of bone: Secondary | ICD-10-CM | POA: Diagnosis not present

## 2020-03-09 DIAGNOSIS — D6481 Anemia due to antineoplastic chemotherapy: Secondary | ICD-10-CM | POA: Diagnosis not present

## 2020-03-09 DIAGNOSIS — T451X5A Adverse effect of antineoplastic and immunosuppressive drugs, initial encounter: Secondary | ICD-10-CM | POA: Diagnosis not present

## 2020-03-09 DIAGNOSIS — D701 Agranulocytosis secondary to cancer chemotherapy: Secondary | ICD-10-CM | POA: Diagnosis not present

## 2020-03-09 DIAGNOSIS — I10 Essential (primary) hypertension: Secondary | ICD-10-CM | POA: Diagnosis not present

## 2020-03-09 DIAGNOSIS — C61 Malignant neoplasm of prostate: Secondary | ICD-10-CM | POA: Diagnosis not present

## 2020-03-09 DIAGNOSIS — R519 Headache, unspecified: Secondary | ICD-10-CM | POA: Diagnosis not present

## 2020-03-09 DIAGNOSIS — D696 Thrombocytopenia, unspecified: Secondary | ICD-10-CM | POA: Diagnosis not present

## 2020-03-09 DIAGNOSIS — D6959 Other secondary thrombocytopenia: Secondary | ICD-10-CM | POA: Diagnosis not present

## 2020-03-09 DIAGNOSIS — R591 Generalized enlarged lymph nodes: Secondary | ICD-10-CM | POA: Diagnosis not present

## 2020-03-09 DIAGNOSIS — Z79899 Other long term (current) drug therapy: Secondary | ICD-10-CM | POA: Diagnosis not present

## 2020-04-20 DIAGNOSIS — T451X5A Adverse effect of antineoplastic and immunosuppressive drugs, initial encounter: Secondary | ICD-10-CM | POA: Diagnosis not present

## 2020-04-20 DIAGNOSIS — D6481 Anemia due to antineoplastic chemotherapy: Secondary | ICD-10-CM | POA: Diagnosis not present

## 2020-06-07 DIAGNOSIS — Z79818 Long term (current) use of other agents affecting estrogen receptors and estrogen levels: Secondary | ICD-10-CM | POA: Diagnosis not present

## 2020-06-07 DIAGNOSIS — C61 Malignant neoplasm of prostate: Secondary | ICD-10-CM | POA: Diagnosis not present

## 2020-06-07 DIAGNOSIS — D701 Agranulocytosis secondary to cancer chemotherapy: Secondary | ICD-10-CM | POA: Diagnosis not present

## 2020-06-07 DIAGNOSIS — R519 Headache, unspecified: Secondary | ICD-10-CM | POA: Diagnosis not present

## 2020-06-07 DIAGNOSIS — D6481 Anemia due to antineoplastic chemotherapy: Secondary | ICD-10-CM | POA: Diagnosis not present

## 2020-06-07 DIAGNOSIS — N1330 Unspecified hydronephrosis: Secondary | ICD-10-CM | POA: Diagnosis not present

## 2020-06-07 DIAGNOSIS — Z5111 Encounter for antineoplastic chemotherapy: Secondary | ICD-10-CM | POA: Diagnosis not present

## 2020-06-07 DIAGNOSIS — C7951 Secondary malignant neoplasm of bone: Secondary | ICD-10-CM | POA: Diagnosis not present

## 2020-06-07 DIAGNOSIS — Z86711 Personal history of pulmonary embolism: Secondary | ICD-10-CM | POA: Diagnosis not present

## 2020-06-07 DIAGNOSIS — I10 Essential (primary) hypertension: Secondary | ICD-10-CM | POA: Diagnosis not present

## 2020-06-07 DIAGNOSIS — Z79899 Other long term (current) drug therapy: Secondary | ICD-10-CM | POA: Diagnosis not present

## 2020-06-07 DIAGNOSIS — T50905A Adverse effect of unspecified drugs, medicaments and biological substances, initial encounter: Secondary | ICD-10-CM | POA: Diagnosis not present

## 2020-06-07 DIAGNOSIS — D6959 Other secondary thrombocytopenia: Secondary | ICD-10-CM | POA: Diagnosis not present

## 2020-06-07 DIAGNOSIS — T451X5A Adverse effect of antineoplastic and immunosuppressive drugs, initial encounter: Secondary | ICD-10-CM | POA: Diagnosis not present

## 2020-06-15 DIAGNOSIS — R1900 Intra-abdominal and pelvic swelling, mass and lump, unspecified site: Secondary | ICD-10-CM | POA: Diagnosis not present

## 2020-06-15 DIAGNOSIS — N1339 Other hydronephrosis: Secondary | ICD-10-CM | POA: Diagnosis not present

## 2020-06-15 DIAGNOSIS — C61 Malignant neoplasm of prostate: Secondary | ICD-10-CM | POA: Diagnosis not present

## 2020-06-21 DIAGNOSIS — M859 Disorder of bone density and structure, unspecified: Secondary | ICD-10-CM | POA: Diagnosis not present

## 2020-06-21 DIAGNOSIS — E785 Hyperlipidemia, unspecified: Secondary | ICD-10-CM | POA: Diagnosis not present

## 2020-06-21 DIAGNOSIS — E291 Testicular hypofunction: Secondary | ICD-10-CM | POA: Diagnosis not present

## 2020-06-21 DIAGNOSIS — Z125 Encounter for screening for malignant neoplasm of prostate: Secondary | ICD-10-CM | POA: Diagnosis not present

## 2020-06-27 DIAGNOSIS — C61 Malignant neoplasm of prostate: Secondary | ICD-10-CM | POA: Diagnosis not present

## 2020-06-27 DIAGNOSIS — R590 Localized enlarged lymph nodes: Secondary | ICD-10-CM | POA: Diagnosis not present

## 2020-06-28 DIAGNOSIS — I10 Essential (primary) hypertension: Secondary | ICD-10-CM | POA: Diagnosis not present

## 2020-06-28 DIAGNOSIS — I700 Atherosclerosis of aorta: Secondary | ICD-10-CM | POA: Diagnosis not present

## 2020-06-28 DIAGNOSIS — I712 Thoracic aortic aneurysm, without rupture: Secondary | ICD-10-CM | POA: Diagnosis not present

## 2020-06-28 DIAGNOSIS — Z0000 Encounter for general adult medical examination without abnormal findings: Secondary | ICD-10-CM | POA: Diagnosis not present

## 2020-06-28 DIAGNOSIS — Z1331 Encounter for screening for depression: Secondary | ICD-10-CM | POA: Diagnosis not present

## 2020-06-28 DIAGNOSIS — C7911 Secondary malignant neoplasm of bladder: Secondary | ICD-10-CM | POA: Diagnosis not present

## 2020-06-28 DIAGNOSIS — C775 Secondary and unspecified malignant neoplasm of intrapelvic lymph nodes: Secondary | ICD-10-CM | POA: Diagnosis not present

## 2020-06-28 DIAGNOSIS — E785 Hyperlipidemia, unspecified: Secondary | ICD-10-CM | POA: Diagnosis not present

## 2020-06-28 DIAGNOSIS — C61 Malignant neoplasm of prostate: Secondary | ICD-10-CM | POA: Diagnosis not present

## 2020-06-28 DIAGNOSIS — R7301 Impaired fasting glucose: Secondary | ICD-10-CM | POA: Diagnosis not present

## 2020-06-28 DIAGNOSIS — R82998 Other abnormal findings in urine: Secondary | ICD-10-CM | POA: Diagnosis not present

## 2020-06-28 DIAGNOSIS — I2510 Atherosclerotic heart disease of native coronary artery without angina pectoris: Secondary | ICD-10-CM | POA: Diagnosis not present

## 2020-06-28 DIAGNOSIS — Z1339 Encounter for screening examination for other mental health and behavioral disorders: Secondary | ICD-10-CM | POA: Diagnosis not present

## 2020-08-11 DIAGNOSIS — C779 Secondary and unspecified malignant neoplasm of lymph node, unspecified: Secondary | ICD-10-CM | POA: Diagnosis not present

## 2020-08-11 DIAGNOSIS — D649 Anemia, unspecified: Secondary | ICD-10-CM | POA: Diagnosis not present

## 2020-08-11 DIAGNOSIS — D6959 Other secondary thrombocytopenia: Secondary | ICD-10-CM | POA: Diagnosis not present

## 2020-08-11 DIAGNOSIS — Z79899 Other long term (current) drug therapy: Secondary | ICD-10-CM | POA: Diagnosis not present

## 2020-08-11 DIAGNOSIS — C7951 Secondary malignant neoplasm of bone: Secondary | ICD-10-CM | POA: Diagnosis not present

## 2020-08-11 DIAGNOSIS — C61 Malignant neoplasm of prostate: Secondary | ICD-10-CM | POA: Diagnosis not present

## 2020-09-01 DIAGNOSIS — C7951 Secondary malignant neoplasm of bone: Secondary | ICD-10-CM | POA: Diagnosis not present

## 2020-09-01 DIAGNOSIS — C61 Malignant neoplasm of prostate: Secondary | ICD-10-CM | POA: Diagnosis not present

## 2020-09-05 DIAGNOSIS — R0981 Nasal congestion: Secondary | ICD-10-CM | POA: Diagnosis not present

## 2020-09-05 DIAGNOSIS — I10 Essential (primary) hypertension: Secondary | ICD-10-CM | POA: Diagnosis not present

## 2020-09-05 DIAGNOSIS — J019 Acute sinusitis, unspecified: Secondary | ICD-10-CM | POA: Diagnosis not present

## 2020-09-15 DIAGNOSIS — C7951 Secondary malignant neoplasm of bone: Secondary | ICD-10-CM | POA: Diagnosis not present

## 2020-09-15 DIAGNOSIS — C61 Malignant neoplasm of prostate: Secondary | ICD-10-CM | POA: Diagnosis not present

## 2020-09-19 DIAGNOSIS — L570 Actinic keratosis: Secondary | ICD-10-CM | POA: Diagnosis not present

## 2020-09-19 DIAGNOSIS — L820 Inflamed seborrheic keratosis: Secondary | ICD-10-CM | POA: Diagnosis not present

## 2020-09-19 DIAGNOSIS — L821 Other seborrheic keratosis: Secondary | ICD-10-CM | POA: Diagnosis not present

## 2020-09-19 DIAGNOSIS — Z85828 Personal history of other malignant neoplasm of skin: Secondary | ICD-10-CM | POA: Diagnosis not present

## 2020-09-19 DIAGNOSIS — D1801 Hemangioma of skin and subcutaneous tissue: Secondary | ICD-10-CM | POA: Diagnosis not present

## 2020-09-19 DIAGNOSIS — B353 Tinea pedis: Secondary | ICD-10-CM | POA: Diagnosis not present

## 2020-09-22 DIAGNOSIS — C7951 Secondary malignant neoplasm of bone: Secondary | ICD-10-CM | POA: Diagnosis not present

## 2020-09-22 DIAGNOSIS — R351 Nocturia: Secondary | ICD-10-CM | POA: Diagnosis not present

## 2020-09-22 DIAGNOSIS — Z79818 Long term (current) use of other agents affecting estrogen receptors and estrogen levels: Secondary | ICD-10-CM | POA: Diagnosis not present

## 2020-09-22 DIAGNOSIS — R5383 Other fatigue: Secondary | ICD-10-CM | POA: Diagnosis not present

## 2020-09-22 DIAGNOSIS — Z9079 Acquired absence of other genital organ(s): Secondary | ICD-10-CM | POA: Diagnosis not present

## 2020-09-22 DIAGNOSIS — J029 Acute pharyngitis, unspecified: Secondary | ICD-10-CM | POA: Diagnosis not present

## 2020-09-22 DIAGNOSIS — D649 Anemia, unspecified: Secondary | ICD-10-CM | POA: Diagnosis not present

## 2020-09-22 DIAGNOSIS — R0981 Nasal congestion: Secondary | ICD-10-CM | POA: Diagnosis not present

## 2020-09-22 DIAGNOSIS — C61 Malignant neoplasm of prostate: Secondary | ICD-10-CM | POA: Diagnosis not present

## 2020-09-22 DIAGNOSIS — Z8616 Personal history of COVID-19: Secondary | ICD-10-CM | POA: Diagnosis not present

## 2020-09-22 DIAGNOSIS — D6959 Other secondary thrombocytopenia: Secondary | ICD-10-CM | POA: Diagnosis not present

## 2020-09-28 DIAGNOSIS — N320 Bladder-neck obstruction: Secondary | ICD-10-CM | POA: Diagnosis not present

## 2020-09-28 DIAGNOSIS — C61 Malignant neoplasm of prostate: Secondary | ICD-10-CM | POA: Diagnosis not present

## 2020-09-28 DIAGNOSIS — N1339 Other hydronephrosis: Secondary | ICD-10-CM | POA: Diagnosis not present

## 2020-09-28 DIAGNOSIS — R1900 Intra-abdominal and pelvic swelling, mass and lump, unspecified site: Secondary | ICD-10-CM | POA: Diagnosis not present

## 2020-09-28 DIAGNOSIS — N3946 Mixed incontinence: Secondary | ICD-10-CM | POA: Diagnosis not present

## 2020-10-13 DIAGNOSIS — C61 Malignant neoplasm of prostate: Secondary | ICD-10-CM | POA: Diagnosis not present

## 2020-10-27 DIAGNOSIS — C61 Malignant neoplasm of prostate: Secondary | ICD-10-CM | POA: Diagnosis not present

## 2020-11-03 DIAGNOSIS — C61 Malignant neoplasm of prostate: Secondary | ICD-10-CM | POA: Diagnosis not present

## 2020-11-03 DIAGNOSIS — C7951 Secondary malignant neoplasm of bone: Secondary | ICD-10-CM | POA: Diagnosis not present

## 2020-11-03 DIAGNOSIS — R232 Flushing: Secondary | ICD-10-CM | POA: Diagnosis not present

## 2020-11-03 DIAGNOSIS — R110 Nausea: Secondary | ICD-10-CM | POA: Diagnosis not present

## 2020-11-03 DIAGNOSIS — D701 Agranulocytosis secondary to cancer chemotherapy: Secondary | ICD-10-CM | POA: Diagnosis not present

## 2020-11-03 DIAGNOSIS — R351 Nocturia: Secondary | ICD-10-CM | POA: Diagnosis not present

## 2020-11-03 DIAGNOSIS — T451X5A Adverse effect of antineoplastic and immunosuppressive drugs, initial encounter: Secondary | ICD-10-CM | POA: Diagnosis not present

## 2020-11-03 DIAGNOSIS — D6959 Other secondary thrombocytopenia: Secondary | ICD-10-CM | POA: Diagnosis not present

## 2020-11-03 DIAGNOSIS — D630 Anemia in neoplastic disease: Secondary | ICD-10-CM | POA: Diagnosis not present

## 2020-11-03 DIAGNOSIS — Z79899 Other long term (current) drug therapy: Secondary | ICD-10-CM | POA: Diagnosis not present

## 2020-11-03 DIAGNOSIS — Z510 Encounter for antineoplastic radiation therapy: Secondary | ICD-10-CM | POA: Diagnosis not present

## 2020-11-24 DIAGNOSIS — C7951 Secondary malignant neoplasm of bone: Secondary | ICD-10-CM | POA: Diagnosis not present

## 2020-11-24 DIAGNOSIS — C61 Malignant neoplasm of prostate: Secondary | ICD-10-CM | POA: Diagnosis not present

## 2020-11-29 DIAGNOSIS — I10 Essential (primary) hypertension: Secondary | ICD-10-CM | POA: Diagnosis not present

## 2020-11-29 DIAGNOSIS — H8113 Benign paroxysmal vertigo, bilateral: Secondary | ICD-10-CM | POA: Diagnosis not present

## 2020-11-29 DIAGNOSIS — C775 Secondary and unspecified malignant neoplasm of intrapelvic lymph nodes: Secondary | ICD-10-CM | POA: Diagnosis not present

## 2020-11-29 DIAGNOSIS — I2510 Atherosclerotic heart disease of native coronary artery without angina pectoris: Secondary | ICD-10-CM | POA: Diagnosis not present

## 2020-11-29 DIAGNOSIS — C7911 Secondary malignant neoplasm of bladder: Secondary | ICD-10-CM | POA: Diagnosis not present

## 2020-12-06 DIAGNOSIS — M1711 Unilateral primary osteoarthritis, right knee: Secondary | ICD-10-CM | POA: Diagnosis not present

## 2020-12-06 DIAGNOSIS — M25561 Pain in right knee: Secondary | ICD-10-CM | POA: Diagnosis not present

## 2020-12-08 DIAGNOSIS — C7951 Secondary malignant neoplasm of bone: Secondary | ICD-10-CM | POA: Diagnosis not present

## 2020-12-08 DIAGNOSIS — C61 Malignant neoplasm of prostate: Secondary | ICD-10-CM | POA: Diagnosis not present

## 2020-12-15 DIAGNOSIS — D649 Anemia, unspecified: Secondary | ICD-10-CM | POA: Diagnosis not present

## 2020-12-15 DIAGNOSIS — Z9079 Acquired absence of other genital organ(s): Secondary | ICD-10-CM | POA: Diagnosis not present

## 2020-12-15 DIAGNOSIS — R232 Flushing: Secondary | ICD-10-CM | POA: Diagnosis not present

## 2020-12-15 DIAGNOSIS — C7951 Secondary malignant neoplasm of bone: Secondary | ICD-10-CM | POA: Diagnosis not present

## 2020-12-15 DIAGNOSIS — G629 Polyneuropathy, unspecified: Secondary | ICD-10-CM | POA: Diagnosis not present

## 2020-12-15 DIAGNOSIS — D6959 Other secondary thrombocytopenia: Secondary | ICD-10-CM | POA: Diagnosis not present

## 2020-12-15 DIAGNOSIS — C61 Malignant neoplasm of prostate: Secondary | ICD-10-CM | POA: Diagnosis not present

## 2020-12-15 DIAGNOSIS — R351 Nocturia: Secondary | ICD-10-CM | POA: Diagnosis not present

## 2020-12-15 DIAGNOSIS — Z79899 Other long term (current) drug therapy: Secondary | ICD-10-CM | POA: Diagnosis not present

## 2020-12-20 DIAGNOSIS — C7951 Secondary malignant neoplasm of bone: Secondary | ICD-10-CM | POA: Diagnosis not present

## 2020-12-20 DIAGNOSIS — N3289 Other specified disorders of bladder: Secondary | ICD-10-CM | POA: Diagnosis not present

## 2020-12-20 DIAGNOSIS — C61 Malignant neoplasm of prostate: Secondary | ICD-10-CM | POA: Diagnosis not present

## 2020-12-21 DIAGNOSIS — M25561 Pain in right knee: Secondary | ICD-10-CM | POA: Diagnosis not present

## 2020-12-21 DIAGNOSIS — M1711 Unilateral primary osteoarthritis, right knee: Secondary | ICD-10-CM | POA: Diagnosis not present

## 2021-01-01 DIAGNOSIS — M1711 Unilateral primary osteoarthritis, right knee: Secondary | ICD-10-CM | POA: Diagnosis not present

## 2021-01-01 DIAGNOSIS — M25561 Pain in right knee: Secondary | ICD-10-CM | POA: Diagnosis not present

## 2021-01-08 DIAGNOSIS — C61 Malignant neoplasm of prostate: Secondary | ICD-10-CM | POA: Diagnosis not present

## 2021-01-09 DIAGNOSIS — I719 Aortic aneurysm of unspecified site, without rupture: Secondary | ICD-10-CM | POA: Diagnosis not present

## 2021-01-09 DIAGNOSIS — C7951 Secondary malignant neoplasm of bone: Secondary | ICD-10-CM | POA: Diagnosis not present

## 2021-01-09 DIAGNOSIS — D8481 Immunodeficiency due to conditions classified elsewhere: Secondary | ICD-10-CM | POA: Diagnosis not present

## 2021-01-09 DIAGNOSIS — E785 Hyperlipidemia, unspecified: Secondary | ICD-10-CM | POA: Diagnosis not present

## 2021-01-09 DIAGNOSIS — D696 Thrombocytopenia, unspecified: Secondary | ICD-10-CM | POA: Diagnosis not present

## 2021-01-09 DIAGNOSIS — C61 Malignant neoplasm of prostate: Secondary | ICD-10-CM | POA: Diagnosis not present

## 2021-01-09 DIAGNOSIS — E669 Obesity, unspecified: Secondary | ICD-10-CM | POA: Diagnosis not present

## 2021-01-09 DIAGNOSIS — I700 Atherosclerosis of aorta: Secondary | ICD-10-CM | POA: Diagnosis not present

## 2021-01-09 DIAGNOSIS — C775 Secondary and unspecified malignant neoplasm of intrapelvic lymph nodes: Secondary | ICD-10-CM | POA: Diagnosis not present

## 2021-01-09 DIAGNOSIS — I77819 Aortic ectasia, unspecified site: Secondary | ICD-10-CM | POA: Diagnosis not present

## 2021-01-09 DIAGNOSIS — C7911 Secondary malignant neoplasm of bladder: Secondary | ICD-10-CM | POA: Diagnosis not present

## 2021-01-09 DIAGNOSIS — I2510 Atherosclerotic heart disease of native coronary artery without angina pectoris: Secondary | ICD-10-CM | POA: Diagnosis not present

## 2021-01-10 DIAGNOSIS — M1711 Unilateral primary osteoarthritis, right knee: Secondary | ICD-10-CM | POA: Diagnosis not present

## 2021-01-10 DIAGNOSIS — M25561 Pain in right knee: Secondary | ICD-10-CM | POA: Diagnosis not present

## 2021-01-17 ENCOUNTER — Other Ambulatory Visit (HOSPITAL_COMMUNITY): Payer: Self-pay

## 2021-01-18 ENCOUNTER — Other Ambulatory Visit: Payer: Self-pay

## 2021-01-18 ENCOUNTER — Encounter (HOSPITAL_COMMUNITY)
Admission: RE | Admit: 2021-01-18 | Discharge: 2021-01-18 | Disposition: A | Discharge status: Home / Self Care | Payer: Medicare HMO | Source: Ambulatory Visit | Attending: Internal Medicine | Admitting: Internal Medicine

## 2021-01-18 DIAGNOSIS — D649 Anemia, unspecified: Secondary | ICD-10-CM | POA: Insufficient documentation

## 2021-01-18 DIAGNOSIS — M25561 Pain in right knee: Secondary | ICD-10-CM | POA: Diagnosis not present

## 2021-01-18 DIAGNOSIS — M1711 Unilateral primary osteoarthritis, right knee: Secondary | ICD-10-CM | POA: Diagnosis not present

## 2021-01-18 LAB — ABO/RH: ABO/RH(D): O POS

## 2021-01-18 LAB — PREPARE RBC (CROSSMATCH)

## 2021-01-19 ENCOUNTER — Encounter (HOSPITAL_COMMUNITY)
Admission: RE | Admit: 2021-01-19 | Discharge: 2021-01-19 | Disposition: A | Discharge status: Home / Self Care | Payer: Medicare HMO | Source: Ambulatory Visit | Attending: Internal Medicine | Admitting: Internal Medicine

## 2021-01-19 DIAGNOSIS — D649 Anemia, unspecified: Secondary | ICD-10-CM | POA: Diagnosis not present

## 2021-01-19 MED ORDER — SODIUM CHLORIDE 0.9% IV SOLUTION: Freq: Once | INTRAVENOUS | Status: DC

## 2021-01-20 LAB — TYPE AND SCREEN
ABO/RH(D): O POS
Antibody Screen: NEGATIVE
Unit division: 0
Unit division: 0

## 2021-01-20 LAB — BPAM RBC
Blood Product Expiration Date: 202212262359
Blood Product Expiration Date: 202301022359
ISSUE DATE / TIME: 202212090852
ISSUE DATE / TIME: 202212091046
Unit Type and Rh: 5100
Unit Type and Rh: 5100

## 2021-01-22 DIAGNOSIS — C61 Malignant neoplasm of prostate: Secondary | ICD-10-CM | POA: Diagnosis not present

## 2021-01-24 DIAGNOSIS — M1711 Unilateral primary osteoarthritis, right knee: Secondary | ICD-10-CM | POA: Diagnosis not present

## 2021-01-24 DIAGNOSIS — M25561 Pain in right knee: Secondary | ICD-10-CM | POA: Diagnosis not present

## 2021-01-26 DIAGNOSIS — D702 Other drug-induced agranulocytosis: Secondary | ICD-10-CM | POA: Diagnosis not present

## 2021-01-26 DIAGNOSIS — C61 Malignant neoplasm of prostate: Secondary | ICD-10-CM | POA: Diagnosis not present

## 2021-01-26 DIAGNOSIS — D61818 Other pancytopenia: Secondary | ICD-10-CM | POA: Diagnosis not present

## 2021-01-26 DIAGNOSIS — D6489 Other specified anemias: Secondary | ICD-10-CM | POA: Diagnosis not present

## 2021-01-26 DIAGNOSIS — C7951 Secondary malignant neoplasm of bone: Secondary | ICD-10-CM | POA: Diagnosis not present

## 2021-01-26 DIAGNOSIS — C775 Secondary and unspecified malignant neoplasm of intrapelvic lymph nodes: Secondary | ICD-10-CM | POA: Diagnosis not present

## 2021-01-26 DIAGNOSIS — Z79899 Other long term (current) drug therapy: Secondary | ICD-10-CM | POA: Diagnosis not present

## 2021-02-16 DIAGNOSIS — C7951 Secondary malignant neoplasm of bone: Secondary | ICD-10-CM | POA: Diagnosis not present

## 2021-02-16 DIAGNOSIS — D6489 Other specified anemias: Secondary | ICD-10-CM | POA: Diagnosis not present

## 2021-02-16 DIAGNOSIS — C61 Malignant neoplasm of prostate: Secondary | ICD-10-CM | POA: Diagnosis not present

## 2021-03-02 DIAGNOSIS — D6489 Other specified anemias: Secondary | ICD-10-CM | POA: Diagnosis not present

## 2021-03-02 DIAGNOSIS — C7951 Secondary malignant neoplasm of bone: Secondary | ICD-10-CM | POA: Diagnosis not present

## 2021-03-05 DIAGNOSIS — C7951 Secondary malignant neoplasm of bone: Secondary | ICD-10-CM | POA: Diagnosis not present

## 2021-03-05 DIAGNOSIS — C61 Malignant neoplasm of prostate: Secondary | ICD-10-CM | POA: Diagnosis not present

## 2021-03-05 DIAGNOSIS — D638 Anemia in other chronic diseases classified elsewhere: Secondary | ICD-10-CM | POA: Diagnosis not present

## 2021-03-05 DIAGNOSIS — C775 Secondary and unspecified malignant neoplasm of intrapelvic lymph nodes: Secondary | ICD-10-CM | POA: Diagnosis not present

## 2021-03-09 DIAGNOSIS — C61 Malignant neoplasm of prostate: Secondary | ICD-10-CM | POA: Diagnosis not present

## 2021-03-09 DIAGNOSIS — D702 Other drug-induced agranulocytosis: Secondary | ICD-10-CM | POA: Diagnosis not present

## 2021-03-09 DIAGNOSIS — D696 Thrombocytopenia, unspecified: Secondary | ICD-10-CM | POA: Diagnosis not present

## 2021-03-09 DIAGNOSIS — C7951 Secondary malignant neoplasm of bone: Secondary | ICD-10-CM | POA: Diagnosis not present

## 2021-03-09 DIAGNOSIS — D638 Anemia in other chronic diseases classified elsewhere: Secondary | ICD-10-CM | POA: Diagnosis not present

## 2021-03-26 DIAGNOSIS — D638 Anemia in other chronic diseases classified elsewhere: Secondary | ICD-10-CM | POA: Diagnosis not present

## 2021-03-26 DIAGNOSIS — C7951 Secondary malignant neoplasm of bone: Secondary | ICD-10-CM | POA: Diagnosis not present

## 2021-03-26 DIAGNOSIS — C61 Malignant neoplasm of prostate: Secondary | ICD-10-CM | POA: Diagnosis not present

## 2021-03-26 DIAGNOSIS — Z79899 Other long term (current) drug therapy: Secondary | ICD-10-CM | POA: Diagnosis not present

## 2021-03-30 DIAGNOSIS — C61 Malignant neoplasm of prostate: Secondary | ICD-10-CM | POA: Diagnosis not present

## 2021-04-03 ENCOUNTER — Other Ambulatory Visit: Payer: Self-pay

## 2021-04-03 ENCOUNTER — Ambulatory Visit (HOSPITAL_COMMUNITY)
Admission: RE | Admit: 2021-04-03 | Discharge: 2021-04-03 | Disposition: A | Discharge status: Home / Self Care | Payer: Medicare HMO | Source: Ambulatory Visit | Attending: Cardiovascular Disease | Admitting: Cardiovascular Disease

## 2021-04-03 ENCOUNTER — Ambulatory Visit: Payer: Medicare HMO

## 2021-04-03 ENCOUNTER — Other Ambulatory Visit: Payer: Self-pay | Admitting: Family Medicine

## 2021-04-03 ENCOUNTER — Other Ambulatory Visit (HOSPITAL_COMMUNITY): Payer: Self-pay | Admitting: Family Medicine

## 2021-04-03 DIAGNOSIS — C775 Secondary and unspecified malignant neoplasm of intrapelvic lymph nodes: Secondary | ICD-10-CM | POA: Diagnosis not present

## 2021-04-03 DIAGNOSIS — M7989 Other specified soft tissue disorders: Secondary | ICD-10-CM | POA: Diagnosis not present

## 2021-04-03 DIAGNOSIS — C7911 Secondary malignant neoplasm of bladder: Secondary | ICD-10-CM | POA: Diagnosis not present

## 2021-04-03 DIAGNOSIS — I10 Essential (primary) hypertension: Secondary | ICD-10-CM | POA: Diagnosis not present

## 2021-04-03 DIAGNOSIS — M549 Dorsalgia, unspecified: Secondary | ICD-10-CM | POA: Diagnosis not present

## 2021-04-03 DIAGNOSIS — M5416 Radiculopathy, lumbar region: Secondary | ICD-10-CM

## 2021-04-03 DIAGNOSIS — I712 Thoracic aortic aneurysm, without rupture, unspecified: Secondary | ICD-10-CM | POA: Diagnosis not present

## 2021-04-04 ENCOUNTER — Ambulatory Visit
Admission: RE | Admit: 2021-04-04 | Discharge: 2021-04-04 | Disposition: A | Discharge status: Home / Self Care | Payer: Medicare HMO | Source: Ambulatory Visit | Attending: Family Medicine | Admitting: Family Medicine

## 2021-04-04 DIAGNOSIS — M4316 Spondylolisthesis, lumbar region: Secondary | ICD-10-CM | POA: Diagnosis not present

## 2021-04-04 DIAGNOSIS — M5416 Radiculopathy, lumbar region: Secondary | ICD-10-CM | POA: Diagnosis not present

## 2021-04-04 DIAGNOSIS — M48061 Spinal stenosis, lumbar region without neurogenic claudication: Secondary | ICD-10-CM | POA: Diagnosis not present

## 2021-04-04 DIAGNOSIS — M5137 Other intervertebral disc degeneration, lumbosacral region: Secondary | ICD-10-CM | POA: Diagnosis not present

## 2021-04-04 MED ORDER — GADOBUTROL 1 MMOL/ML IV SOLN
10.0000 mL | Freq: Once | INTRAVENOUS | Status: AC | PRN
  Administered 2021-04-04: 10 mL via INTRAVENOUS

## 2021-04-10 DIAGNOSIS — M5416 Radiculopathy, lumbar region: Secondary | ICD-10-CM | POA: Diagnosis not present

## 2021-04-13 DIAGNOSIS — C61 Malignant neoplasm of prostate: Secondary | ICD-10-CM | POA: Diagnosis not present

## 2021-04-18 DIAGNOSIS — C7951 Secondary malignant neoplasm of bone: Secondary | ICD-10-CM | POA: Diagnosis not present

## 2021-04-18 DIAGNOSIS — C61 Malignant neoplasm of prostate: Secondary | ICD-10-CM | POA: Diagnosis not present

## 2021-04-18 DIAGNOSIS — D638 Anemia in other chronic diseases classified elsewhere: Secondary | ICD-10-CM | POA: Diagnosis not present

## 2021-04-30 DIAGNOSIS — C775 Secondary and unspecified malignant neoplasm of intrapelvic lymph nodes: Secondary | ICD-10-CM | POA: Diagnosis not present

## 2021-04-30 DIAGNOSIS — C61 Malignant neoplasm of prostate: Secondary | ICD-10-CM | POA: Diagnosis not present

## 2021-04-30 DIAGNOSIS — C7951 Secondary malignant neoplasm of bone: Secondary | ICD-10-CM | POA: Diagnosis not present

## 2021-04-30 DIAGNOSIS — Z79899 Other long term (current) drug therapy: Secondary | ICD-10-CM | POA: Diagnosis not present

## 2021-05-02 DIAGNOSIS — C61 Malignant neoplasm of prostate: Secondary | ICD-10-CM | POA: Diagnosis not present

## 2021-05-02 DIAGNOSIS — C775 Secondary and unspecified malignant neoplasm of intrapelvic lymph nodes: Secondary | ICD-10-CM | POA: Diagnosis not present

## 2021-05-02 DIAGNOSIS — C7951 Secondary malignant neoplasm of bone: Secondary | ICD-10-CM | POA: Diagnosis not present

## 2021-05-02 DIAGNOSIS — D638 Anemia in other chronic diseases classified elsewhere: Secondary | ICD-10-CM | POA: Diagnosis not present

## 2021-05-03 DIAGNOSIS — C61 Malignant neoplasm of prostate: Secondary | ICD-10-CM | POA: Diagnosis not present

## 2021-05-08 DIAGNOSIS — L84 Corns and callosities: Secondary | ICD-10-CM | POA: Diagnosis not present

## 2021-05-08 DIAGNOSIS — R300 Dysuria: Secondary | ICD-10-CM | POA: Diagnosis not present

## 2021-05-08 DIAGNOSIS — R82998 Other abnormal findings in urine: Secondary | ICD-10-CM | POA: Diagnosis not present

## 2021-05-16 DIAGNOSIS — D638 Anemia in other chronic diseases classified elsewhere: Secondary | ICD-10-CM | POA: Diagnosis not present

## 2021-05-16 DIAGNOSIS — Z79899 Other long term (current) drug therapy: Secondary | ICD-10-CM | POA: Diagnosis not present

## 2021-05-16 DIAGNOSIS — C61 Malignant neoplasm of prostate: Secondary | ICD-10-CM | POA: Diagnosis not present

## 2021-05-28 DIAGNOSIS — C775 Secondary and unspecified malignant neoplasm of intrapelvic lymph nodes: Secondary | ICD-10-CM | POA: Diagnosis not present

## 2021-05-28 DIAGNOSIS — D696 Thrombocytopenia, unspecified: Secondary | ICD-10-CM | POA: Diagnosis not present

## 2021-05-28 DIAGNOSIS — D6489 Other specified anemias: Secondary | ICD-10-CM | POA: Diagnosis not present

## 2021-05-28 DIAGNOSIS — Z79899 Other long term (current) drug therapy: Secondary | ICD-10-CM | POA: Diagnosis not present

## 2021-05-28 DIAGNOSIS — D701 Agranulocytosis secondary to cancer chemotherapy: Secondary | ICD-10-CM | POA: Diagnosis not present

## 2021-05-28 DIAGNOSIS — D649 Anemia, unspecified: Secondary | ICD-10-CM | POA: Diagnosis not present

## 2021-05-28 DIAGNOSIS — C61 Malignant neoplasm of prostate: Secondary | ICD-10-CM | POA: Diagnosis not present

## 2021-05-28 DIAGNOSIS — T451X5A Adverse effect of antineoplastic and immunosuppressive drugs, initial encounter: Secondary | ICD-10-CM | POA: Diagnosis not present

## 2021-05-28 DIAGNOSIS — D72819 Decreased white blood cell count, unspecified: Secondary | ICD-10-CM | POA: Diagnosis not present

## 2021-05-28 DIAGNOSIS — D61818 Other pancytopenia: Secondary | ICD-10-CM | POA: Diagnosis not present

## 2021-05-28 DIAGNOSIS — Z79818 Long term (current) use of other agents affecting estrogen receptors and estrogen levels: Secondary | ICD-10-CM | POA: Diagnosis not present

## 2021-05-28 DIAGNOSIS — Z7189 Other specified counseling: Secondary | ICD-10-CM | POA: Diagnosis not present

## 2021-06-14 DIAGNOSIS — D701 Agranulocytosis secondary to cancer chemotherapy: Secondary | ICD-10-CM | POA: Diagnosis not present

## 2021-06-14 DIAGNOSIS — T451X5A Adverse effect of antineoplastic and immunosuppressive drugs, initial encounter: Secondary | ICD-10-CM | POA: Diagnosis not present

## 2021-06-14 DIAGNOSIS — C775 Secondary and unspecified malignant neoplasm of intrapelvic lymph nodes: Secondary | ICD-10-CM | POA: Diagnosis not present

## 2021-06-14 DIAGNOSIS — N1330 Unspecified hydronephrosis: Secondary | ICD-10-CM | POA: Diagnosis not present

## 2021-06-14 DIAGNOSIS — R222 Localized swelling, mass and lump, trunk: Secondary | ICD-10-CM | POA: Diagnosis not present

## 2021-06-14 DIAGNOSIS — Z79899 Other long term (current) drug therapy: Secondary | ICD-10-CM | POA: Diagnosis not present

## 2021-06-14 DIAGNOSIS — Y849 Medical procedure, unspecified as the cause of abnormal reaction of the patient, or of later complication, without mention of misadventure at the time of the procedure: Secondary | ICD-10-CM | POA: Diagnosis not present

## 2021-06-14 DIAGNOSIS — C61 Malignant neoplasm of prostate: Secondary | ICD-10-CM | POA: Diagnosis not present

## 2021-06-14 DIAGNOSIS — Z79818 Long term (current) use of other agents affecting estrogen receptors and estrogen levels: Secondary | ICD-10-CM | POA: Diagnosis not present

## 2021-06-18 DIAGNOSIS — C775 Secondary and unspecified malignant neoplasm of intrapelvic lymph nodes: Secondary | ICD-10-CM | POA: Diagnosis not present

## 2021-06-18 DIAGNOSIS — D701 Agranulocytosis secondary to cancer chemotherapy: Secondary | ICD-10-CM | POA: Diagnosis not present

## 2021-06-18 DIAGNOSIS — D72819 Decreased white blood cell count, unspecified: Secondary | ICD-10-CM | POA: Diagnosis not present

## 2021-06-18 DIAGNOSIS — T451X5A Adverse effect of antineoplastic and immunosuppressive drugs, initial encounter: Secondary | ICD-10-CM | POA: Diagnosis not present

## 2021-06-18 DIAGNOSIS — Z7189 Other specified counseling: Secondary | ICD-10-CM | POA: Diagnosis not present

## 2021-06-18 DIAGNOSIS — D6489 Other specified anemias: Secondary | ICD-10-CM | POA: Diagnosis not present

## 2021-06-18 DIAGNOSIS — Z79818 Long term (current) use of other agents affecting estrogen receptors and estrogen levels: Secondary | ICD-10-CM | POA: Diagnosis not present

## 2021-06-18 DIAGNOSIS — C7951 Secondary malignant neoplasm of bone: Secondary | ICD-10-CM | POA: Diagnosis not present

## 2021-06-18 DIAGNOSIS — C61 Malignant neoplasm of prostate: Secondary | ICD-10-CM | POA: Diagnosis not present

## 2021-06-18 DIAGNOSIS — D696 Thrombocytopenia, unspecified: Secondary | ICD-10-CM | POA: Diagnosis not present

## 2021-06-20 ENCOUNTER — Ambulatory Visit: Payer: Medicare HMO | Admitting: Podiatry

## 2021-06-20 ENCOUNTER — Encounter: Payer: Self-pay | Admitting: Podiatry

## 2021-06-20 DIAGNOSIS — M79674 Pain in right toe(s): Secondary | ICD-10-CM

## 2021-06-20 DIAGNOSIS — B351 Tinea unguium: Secondary | ICD-10-CM

## 2021-06-20 DIAGNOSIS — M79675 Pain in left toe(s): Secondary | ICD-10-CM

## 2021-06-20 DIAGNOSIS — B070 Plantar wart: Secondary | ICD-10-CM

## 2021-06-20 NOTE — Patient Instructions (Signed)
Take dressing off in 8 hours and wash the foot with soap and water. If it is hurting or becomes uncomfortable before the 8 hours, go ahead and remove the bandage and wash the area.  ?If it blisters, apply antibiotic ointment and a band-aid.  ? ? ? ?Once it heals, if there is still a painful area you can re apply salicylic acid for a few days until it blisters again. This is in the foot care section at the pharmacy  ?

## 2021-06-24 ENCOUNTER — Encounter: Payer: Self-pay | Admitting: Podiatry

## 2021-06-24 NOTE — Progress Notes (Signed)
?  Subjective:  ?Patient ID: Nicolas Woodward, male    DOB: 1935/12/17,  MRN: [id number] ? ?Chief Complaint  ?Patient presents with  ? Callouses  ?  (New Patient) ?Diagnosis: Foot callus/ right foot pain Referring Provider: Claud Kelp S  ? ? ?86 y.o. male presents with the above complaint. History confirmed with patient.  His nails are also thickened elongated and they cause discomfort he is also unable to trim them.  Thinks he may have a callus or wart in the foot. ? ?Objective:  ?Physical Exam: ?warm, good capillary refill, no trophic changes or ulcerative lesions, normal DP and PT pulses, and normal sensory exam. ?Left Foot: dystrophic yellowed discolored nail plates with subungual debris ?Right Foot:  ?Assessment:  ? ?1. Pain due to onychomycosis of toenails of both feet   ?2. Verruca plantaris   ? ? ? ?Plan:  ?Patient was evaluated and treated and all questions answered. ? ?Discussed the etiology and treatment options for the condition in detail with the patient. Educated patient on the topical and oral treatment options for mycotic nails. Recommended debridement of the nails today. Sharp and mechanical debridement performed of all painful and mycotic nails today. Nails debrided in length and thickness using a nail nipper to level of comfort. Discussed treatment options including appropriate shoe gear. Follow up as needed for painful nails. ? ? ?Discussed etiology and treatment of verruca plantaris in detail with the patient as well as multiple treatment options including blistering agents, chemotherapeutic agents, surgical excision, laser therapy and the indications and roles of the above.  Today, recommended treatment with salicylic acid as noted in procedure note below.  I recommend he continue salicylic acid treatment at home.  Return to see me as needed. ? ?Procedure: Destruction of Lesion ?Location: Plantar right arch ?Instrumentation: 15 blade. ?Technique: Debridement of lesion to petechial bleeding.  Aperture pad applied around lesion. Small amount of salinocaine applied to the base of the lesion. ?Dressing: Dry, sterile, compression dressing. ?Disposition: Patient tolerated procedure well. Advised to leave dressing on for 6-8 hours. Thereafter patient to wash the area with soap and water and applied band-aid.  ? ? ?Return if symptoms worsen or fail to improve.  ? ?

## 2021-06-28 DIAGNOSIS — D649 Anemia, unspecified: Secondary | ICD-10-CM | POA: Diagnosis not present

## 2021-06-28 DIAGNOSIS — D6489 Other specified anemias: Secondary | ICD-10-CM | POA: Diagnosis not present

## 2021-06-28 DIAGNOSIS — D72810 Lymphocytopenia: Secondary | ICD-10-CM | POA: Diagnosis not present

## 2021-06-28 DIAGNOSIS — D696 Thrombocytopenia, unspecified: Secondary | ICD-10-CM | POA: Diagnosis not present

## 2021-06-28 DIAGNOSIS — Z9079 Acquired absence of other genital organ(s): Secondary | ICD-10-CM | POA: Diagnosis not present

## 2021-06-28 DIAGNOSIS — Z923 Personal history of irradiation: Secondary | ICD-10-CM | POA: Diagnosis not present

## 2021-06-28 DIAGNOSIS — D709 Neutropenia, unspecified: Secondary | ICD-10-CM | POA: Diagnosis not present

## 2021-06-28 DIAGNOSIS — C61 Malignant neoplasm of prostate: Secondary | ICD-10-CM | POA: Diagnosis not present

## 2021-06-28 DIAGNOSIS — D61818 Other pancytopenia: Secondary | ICD-10-CM | POA: Diagnosis not present

## 2021-06-28 DIAGNOSIS — Z9221 Personal history of antineoplastic chemotherapy: Secondary | ICD-10-CM | POA: Diagnosis not present

## 2021-07-06 DIAGNOSIS — C775 Secondary and unspecified malignant neoplasm of intrapelvic lymph nodes: Secondary | ICD-10-CM | POA: Diagnosis not present

## 2021-07-06 DIAGNOSIS — C61 Malignant neoplasm of prostate: Secondary | ICD-10-CM | POA: Diagnosis not present

## 2021-07-06 DIAGNOSIS — C7951 Secondary malignant neoplasm of bone: Secondary | ICD-10-CM | POA: Diagnosis not present

## 2021-07-12 DIAGNOSIS — R5383 Other fatigue: Secondary | ICD-10-CM | POA: Diagnosis not present

## 2021-07-12 DIAGNOSIS — C775 Secondary and unspecified malignant neoplasm of intrapelvic lymph nodes: Secondary | ICD-10-CM | POA: Diagnosis not present

## 2021-07-12 DIAGNOSIS — Z79818 Long term (current) use of other agents affecting estrogen receptors and estrogen levels: Secondary | ICD-10-CM | POA: Diagnosis not present

## 2021-07-12 DIAGNOSIS — D6481 Anemia due to antineoplastic chemotherapy: Secondary | ICD-10-CM | POA: Diagnosis not present

## 2021-07-12 DIAGNOSIS — Z79899 Other long term (current) drug therapy: Secondary | ICD-10-CM | POA: Diagnosis not present

## 2021-07-12 DIAGNOSIS — Z7189 Other specified counseling: Secondary | ICD-10-CM | POA: Diagnosis not present

## 2021-07-12 DIAGNOSIS — D649 Anemia, unspecified: Secondary | ICD-10-CM | POA: Diagnosis not present

## 2021-07-12 DIAGNOSIS — D701 Agranulocytosis secondary to cancer chemotherapy: Secondary | ICD-10-CM | POA: Diagnosis not present

## 2021-07-12 DIAGNOSIS — Z8042 Family history of malignant neoplasm of prostate: Secondary | ICD-10-CM | POA: Diagnosis not present

## 2021-07-12 DIAGNOSIS — D61818 Other pancytopenia: Secondary | ICD-10-CM | POA: Diagnosis not present

## 2021-07-12 DIAGNOSIS — K5900 Constipation, unspecified: Secondary | ICD-10-CM | POA: Diagnosis not present

## 2021-07-12 DIAGNOSIS — C61 Malignant neoplasm of prostate: Secondary | ICD-10-CM | POA: Diagnosis not present

## 2021-07-12 DIAGNOSIS — T451X5A Adverse effect of antineoplastic and immunosuppressive drugs, initial encounter: Secondary | ICD-10-CM | POA: Diagnosis not present

## 2021-07-12 DIAGNOSIS — Z803 Family history of malignant neoplasm of breast: Secondary | ICD-10-CM | POA: Diagnosis not present

## 2021-07-23 DIAGNOSIS — Z6832 Body mass index (BMI) 32.0-32.9, adult: Secondary | ICD-10-CM | POA: Diagnosis not present

## 2021-07-23 DIAGNOSIS — M62838 Other muscle spasm: Secondary | ICD-10-CM | POA: Diagnosis not present

## 2021-07-23 DIAGNOSIS — E669 Obesity, unspecified: Secondary | ICD-10-CM | POA: Diagnosis not present

## 2021-07-23 DIAGNOSIS — Z791 Long term (current) use of non-steroidal anti-inflammatories (NSAID): Secondary | ICD-10-CM | POA: Diagnosis not present

## 2021-07-23 DIAGNOSIS — Z8249 Family history of ischemic heart disease and other diseases of the circulatory system: Secondary | ICD-10-CM | POA: Diagnosis not present

## 2021-07-23 DIAGNOSIS — E291 Testicular hypofunction: Secondary | ICD-10-CM | POA: Diagnosis not present

## 2021-07-23 DIAGNOSIS — Z803 Family history of malignant neoplasm of breast: Secondary | ICD-10-CM | POA: Diagnosis not present

## 2021-07-23 DIAGNOSIS — N529 Male erectile dysfunction, unspecified: Secondary | ICD-10-CM | POA: Diagnosis not present

## 2021-07-23 DIAGNOSIS — R32 Unspecified urinary incontinence: Secondary | ICD-10-CM | POA: Diagnosis not present

## 2021-07-23 DIAGNOSIS — M1990 Unspecified osteoarthritis, unspecified site: Secondary | ICD-10-CM | POA: Diagnosis not present

## 2021-07-23 DIAGNOSIS — Z8546 Personal history of malignant neoplasm of prostate: Secondary | ICD-10-CM | POA: Diagnosis not present

## 2021-07-24 DIAGNOSIS — E785 Hyperlipidemia, unspecified: Secondary | ICD-10-CM | POA: Diagnosis not present

## 2021-07-24 DIAGNOSIS — M859 Disorder of bone density and structure, unspecified: Secondary | ICD-10-CM | POA: Diagnosis not present

## 2021-07-24 DIAGNOSIS — R7301 Impaired fasting glucose: Secondary | ICD-10-CM | POA: Diagnosis not present

## 2021-07-27 DIAGNOSIS — D701 Agranulocytosis secondary to cancer chemotherapy: Secondary | ICD-10-CM | POA: Diagnosis not present

## 2021-07-27 DIAGNOSIS — Z79899 Other long term (current) drug therapy: Secondary | ICD-10-CM | POA: Diagnosis not present

## 2021-07-27 DIAGNOSIS — C61 Malignant neoplasm of prostate: Secondary | ICD-10-CM | POA: Diagnosis not present

## 2021-07-27 DIAGNOSIS — D649 Anemia, unspecified: Secondary | ICD-10-CM | POA: Diagnosis not present

## 2021-07-27 DIAGNOSIS — Z79818 Long term (current) use of other agents affecting estrogen receptors and estrogen levels: Secondary | ICD-10-CM | POA: Diagnosis not present

## 2021-07-27 DIAGNOSIS — R0602 Shortness of breath: Secondary | ICD-10-CM | POA: Diagnosis not present

## 2021-07-27 DIAGNOSIS — R5383 Other fatigue: Secondary | ICD-10-CM | POA: Diagnosis not present

## 2021-07-27 DIAGNOSIS — C775 Secondary and unspecified malignant neoplasm of intrapelvic lymph nodes: Secondary | ICD-10-CM | POA: Diagnosis not present

## 2021-07-27 DIAGNOSIS — T451X5A Adverse effect of antineoplastic and immunosuppressive drugs, initial encounter: Secondary | ICD-10-CM | POA: Diagnosis not present

## 2021-07-27 DIAGNOSIS — Z803 Family history of malignant neoplasm of breast: Secondary | ICD-10-CM | POA: Diagnosis not present

## 2021-07-27 DIAGNOSIS — Z8042 Family history of malignant neoplasm of prostate: Secondary | ICD-10-CM | POA: Diagnosis not present

## 2021-07-30 DIAGNOSIS — R82998 Other abnormal findings in urine: Secondary | ICD-10-CM | POA: Diagnosis not present

## 2021-08-02 DIAGNOSIS — D649 Anemia, unspecified: Secondary | ICD-10-CM | POA: Diagnosis not present

## 2021-08-02 DIAGNOSIS — E785 Hyperlipidemia, unspecified: Secondary | ICD-10-CM | POA: Diagnosis not present

## 2021-08-02 DIAGNOSIS — I712 Thoracic aortic aneurysm, without rupture, unspecified: Secondary | ICD-10-CM | POA: Diagnosis not present

## 2021-08-02 DIAGNOSIS — R310 Gross hematuria: Secondary | ICD-10-CM | POA: Diagnosis not present

## 2021-08-02 DIAGNOSIS — Z1331 Encounter for screening for depression: Secondary | ICD-10-CM | POA: Diagnosis not present

## 2021-08-02 DIAGNOSIS — C61 Malignant neoplasm of prostate: Secondary | ICD-10-CM | POA: Diagnosis not present

## 2021-08-02 DIAGNOSIS — I10 Essential (primary) hypertension: Secondary | ICD-10-CM | POA: Diagnosis not present

## 2021-08-02 DIAGNOSIS — C775 Secondary and unspecified malignant neoplasm of intrapelvic lymph nodes: Secondary | ICD-10-CM | POA: Diagnosis not present

## 2021-08-02 DIAGNOSIS — I700 Atherosclerosis of aorta: Secondary | ICD-10-CM | POA: Diagnosis not present

## 2021-08-02 DIAGNOSIS — R7301 Impaired fasting glucose: Secondary | ICD-10-CM | POA: Diagnosis not present

## 2021-08-02 DIAGNOSIS — Z1339 Encounter for screening examination for other mental health and behavioral disorders: Secondary | ICD-10-CM | POA: Diagnosis not present

## 2021-08-02 DIAGNOSIS — C7911 Secondary malignant neoplasm of bladder: Secondary | ICD-10-CM | POA: Diagnosis not present

## 2021-08-02 DIAGNOSIS — Z0000 Encounter for general adult medical examination without abnormal findings: Secondary | ICD-10-CM | POA: Diagnosis not present

## 2021-08-02 DIAGNOSIS — I2510 Atherosclerotic heart disease of native coronary artery without angina pectoris: Secondary | ICD-10-CM | POA: Diagnosis not present

## 2021-08-03 ENCOUNTER — Encounter (HOSPITAL_BASED_OUTPATIENT_CLINIC_OR_DEPARTMENT_OTHER): Payer: Self-pay

## 2021-08-03 ENCOUNTER — Other Ambulatory Visit: Payer: Self-pay

## 2021-08-03 ENCOUNTER — Emergency Department (HOSPITAL_BASED_OUTPATIENT_CLINIC_OR_DEPARTMENT_OTHER)
Admission: EM | Admit: 2021-08-03 | Discharge: 2021-08-04 | Disposition: A | Discharge status: Home / Self Care | Payer: Medicare HMO | Attending: Emergency Medicine | Admitting: Emergency Medicine

## 2021-08-03 ENCOUNTER — Emergency Department (HOSPITAL_BASED_OUTPATIENT_CLINIC_OR_DEPARTMENT_OTHER): Payer: Medicare HMO

## 2021-08-03 DIAGNOSIS — Z8546 Personal history of malignant neoplasm of prostate: Secondary | ICD-10-CM | POA: Insufficient documentation

## 2021-08-03 DIAGNOSIS — K5730 Diverticulosis of large intestine without perforation or abscess without bleeding: Secondary | ICD-10-CM | POA: Diagnosis not present

## 2021-08-03 DIAGNOSIS — R310 Gross hematuria: Secondary | ICD-10-CM | POA: Insufficient documentation

## 2021-08-03 DIAGNOSIS — N134 Hydroureter: Secondary | ICD-10-CM | POA: Diagnosis not present

## 2021-08-03 DIAGNOSIS — K5900 Constipation, unspecified: Secondary | ICD-10-CM | POA: Diagnosis not present

## 2021-08-03 DIAGNOSIS — D61818 Other pancytopenia: Secondary | ICD-10-CM

## 2021-08-03 DIAGNOSIS — D649 Anemia, unspecified: Secondary | ICD-10-CM

## 2021-08-03 DIAGNOSIS — C61 Malignant neoplasm of prostate: Secondary | ICD-10-CM

## 2021-08-03 DIAGNOSIS — N1330 Unspecified hydronephrosis: Secondary | ICD-10-CM | POA: Diagnosis not present

## 2021-08-03 DIAGNOSIS — R319 Hematuria, unspecified: Secondary | ICD-10-CM | POA: Diagnosis not present

## 2021-08-03 LAB — PREPARE RBC (CROSSMATCH)

## 2021-08-03 LAB — DIFFERENTIAL
Abs Immature Granulocytes: 0 10*3/uL (ref 0.00–0.07)
Basophils Absolute: 0 10*3/uL (ref 0.0–0.1)
Basophils Relative: 2 %
Eosinophils Absolute: 0 10*3/uL (ref 0.0–0.5)
Eosinophils Relative: 2 %
Lymphocytes Relative: 42 %
Lymphs Abs: 0.3 10*3/uL — ABNORMAL LOW (ref 0.7–4.0)
Monocytes Absolute: 0.1 10*3/uL (ref 0.1–1.0)
Monocytes Relative: 16 %
Neutro Abs: 0.3 10*3/uL — CL (ref 1.7–7.7)
Neutrophils Relative %: 38 %
Smear Review: DECREASED

## 2021-08-03 LAB — COMPREHENSIVE METABOLIC PANEL
ALT: 34 U/L (ref 0–44)
AST: 23 U/L (ref 15–41)
Albumin: 3.7 g/dL (ref 3.5–5.0)
Alkaline Phosphatase: 37 U/L — ABNORMAL LOW (ref 38–126)
Anion gap: 7 (ref 5–15)
BUN: 20 mg/dL (ref 8–23)
CO2: 27 mmol/L (ref 22–32)
Calcium: 9.1 mg/dL (ref 8.9–10.3)
Chloride: 102 mmol/L (ref 98–111)
Creatinine, Ser: 1.13 mg/dL (ref 0.61–1.24)
GFR, Estimated: >60 mL/min (ref >60)
Glucose, Bld: 124 mg/dL — ABNORMAL HIGH (ref 70–99)
Potassium: 4.2 mmol/L (ref 3.5–5.1)
Sodium: 136 mmol/L (ref 135–145)
Total Bilirubin: 0.6 mg/dL (ref 0.3–1.2)
Total Protein: 5.6 g/dL — ABNORMAL LOW (ref 6.5–8.1)

## 2021-08-03 LAB — CBC
HCT: 19.7 % — ABNORMAL LOW (ref 39.0–52.0)
Hemoglobin: 6.5 g/dL — CL (ref 13.0–17.0)
MCH: 31.4 pg (ref 26.0–34.0)
MCHC: 33 g/dL (ref 30.0–36.0)
MCV: 95.2 fL (ref 80.0–100.0)
Platelets: 123 10*3/uL — ABNORMAL LOW (ref 150–400)
RBC: 2.07 MIL/uL — ABNORMAL LOW (ref 4.22–5.81)
RDW: 16.8 % — ABNORMAL HIGH (ref 11.5–15.5)
WBC: 0.8 10*3/uL — CL (ref 4.0–10.5)
nRBC: 0 % (ref 0.0–0.2)

## 2021-08-03 LAB — PROTIME-INR
INR: 1.2 (ref 0.8–1.2)
Prothrombin Time: 14.6 seconds (ref 11.4–15.2)

## 2021-08-03 LAB — LACTATE DEHYDROGENASE: LDH: 255 U/L — ABNORMAL HIGH (ref 98–192)

## 2021-08-03 LAB — LIPASE, BLOOD: Lipase: 16 U/L (ref 11–51)

## 2021-08-03 MED ORDER — SODIUM CHLORIDE 0.9% IV SOLUTION: Freq: Once | INTRAVENOUS | Status: AC

## 2021-08-03 MED ORDER — IOHEXOL 300 MG/ML  SOLN
100.0000 mL | Freq: Once | INTRAMUSCULAR | Status: AC | PRN
  Administered 2021-08-03: 100 mL via INTRAVENOUS

## 2021-08-03 NOTE — ED Triage Notes (Signed)
Pt c/o lower abdominal discomfort, blood in his urine, and pain with urination since yesterday.

## 2021-08-03 NOTE — ED Notes (Signed)
Per [employer] bld products are ready for pickup. [employer]

## 2021-08-03 NOTE — ED Notes (Signed)
Handoff report given to [facility] 

## 2021-08-07 LAB — TYPE AND SCREEN
ABO/RH(D): O POS
Antibody Screen: NEGATIVE
Unit division: 0
Unit division: 0

## 2021-08-07 LAB — BPAM RBC
Blood Product Expiration Date: 202307212359
Blood Product Expiration Date: 202307212359
ISSUE DATE / TIME: 202306232316
Unit Type and Rh: 5100
Unit Type and Rh: 5100

## 2021-08-09 DIAGNOSIS — T451X5A Adverse effect of antineoplastic and immunosuppressive drugs, initial encounter: Secondary | ICD-10-CM | POA: Diagnosis not present

## 2021-08-09 DIAGNOSIS — C775 Secondary and unspecified malignant neoplasm of intrapelvic lymph nodes: Secondary | ICD-10-CM | POA: Diagnosis not present

## 2021-08-09 DIAGNOSIS — Z7189 Other specified counseling: Secondary | ICD-10-CM | POA: Diagnosis not present

## 2021-08-09 DIAGNOSIS — Z79818 Long term (current) use of other agents affecting estrogen receptors and estrogen levels: Secondary | ICD-10-CM | POA: Diagnosis not present

## 2021-08-09 DIAGNOSIS — Z79899 Other long term (current) drug therapy: Secondary | ICD-10-CM | POA: Diagnosis not present

## 2021-08-09 DIAGNOSIS — Z9079 Acquired absence of other genital organ(s): Secondary | ICD-10-CM | POA: Diagnosis not present

## 2021-08-09 DIAGNOSIS — D649 Anemia, unspecified: Secondary | ICD-10-CM | POA: Diagnosis not present

## 2021-08-09 DIAGNOSIS — D61818 Other pancytopenia: Secondary | ICD-10-CM | POA: Diagnosis not present

## 2021-08-09 DIAGNOSIS — C61 Malignant neoplasm of prostate: Secondary | ICD-10-CM | POA: Diagnosis not present

## 2021-08-09 DIAGNOSIS — D701 Agranulocytosis secondary to cancer chemotherapy: Secondary | ICD-10-CM | POA: Diagnosis not present

## 2021-08-14 ENCOUNTER — Other Ambulatory Visit: Payer: Self-pay

## 2021-08-14 ENCOUNTER — Encounter: Payer: Self-pay | Admitting: Emergency Medicine

## 2021-08-14 DIAGNOSIS — Z79899 Other long term (current) drug therapy: Secondary | ICD-10-CM

## 2021-08-14 DIAGNOSIS — R1030 Lower abdominal pain, unspecified: Secondary | ICD-10-CM | POA: Diagnosis not present

## 2021-08-14 DIAGNOSIS — E669 Obesity, unspecified: Secondary | ICD-10-CM | POA: Diagnosis present

## 2021-08-14 DIAGNOSIS — C61 Malignant neoplasm of prostate: Secondary | ICD-10-CM | POA: Diagnosis present

## 2021-08-14 DIAGNOSIS — K56609 Unspecified intestinal obstruction, unspecified as to partial versus complete obstruction: Secondary | ICD-10-CM | POA: Diagnosis not present

## 2021-08-14 DIAGNOSIS — R32 Unspecified urinary incontinence: Secondary | ICD-10-CM | POA: Diagnosis present

## 2021-08-14 DIAGNOSIS — C7989 Secondary malignant neoplasm of other specified sites: Principal | ICD-10-CM | POA: Diagnosis present

## 2021-08-14 DIAGNOSIS — R600 Localized edema: Secondary | ICD-10-CM | POA: Diagnosis present

## 2021-08-14 DIAGNOSIS — I700 Atherosclerosis of aorta: Secondary | ICD-10-CM | POA: Diagnosis present

## 2021-08-14 DIAGNOSIS — M8580 Other specified disorders of bone density and structure, unspecified site: Secondary | ICD-10-CM | POA: Diagnosis present

## 2021-08-14 DIAGNOSIS — Z809 Family history of malignant neoplasm, unspecified: Secondary | ICD-10-CM

## 2021-08-14 DIAGNOSIS — I712 Thoracic aortic aneurysm, without rupture, unspecified: Secondary | ICD-10-CM | POA: Diagnosis present

## 2021-08-14 DIAGNOSIS — E291 Testicular hypofunction: Secondary | ICD-10-CM | POA: Diagnosis present

## 2021-08-14 DIAGNOSIS — Z515 Encounter for palliative care: Secondary | ICD-10-CM

## 2021-08-14 DIAGNOSIS — Z6831 Body mass index (BMI) 31.0-31.9, adult: Secondary | ICD-10-CM

## 2021-08-14 DIAGNOSIS — N329 Bladder disorder, unspecified: Secondary | ICD-10-CM | POA: Diagnosis present

## 2021-08-14 DIAGNOSIS — N136 Pyonephrosis: Secondary | ICD-10-CM | POA: Diagnosis present

## 2021-08-14 DIAGNOSIS — D539 Nutritional anemia, unspecified: Secondary | ICD-10-CM | POA: Diagnosis present

## 2021-08-14 DIAGNOSIS — M1990 Unspecified osteoarthritis, unspecified site: Secondary | ICD-10-CM | POA: Diagnosis present

## 2021-08-14 DIAGNOSIS — Z66 Do not resuscitate: Secondary | ICD-10-CM | POA: Diagnosis present

## 2021-08-14 DIAGNOSIS — D61818 Other pancytopenia: Secondary | ICD-10-CM | POA: Diagnosis present

## 2021-08-14 DIAGNOSIS — E785 Hyperlipidemia, unspecified: Secondary | ICD-10-CM | POA: Diagnosis present

## 2021-08-14 LAB — LIPASE, BLOOD: Lipase: 24 U/L (ref 11–51)

## 2021-08-14 LAB — COMPREHENSIVE METABOLIC PANEL
ALT: 41 U/L (ref 0–44)
AST: 35 U/L (ref 15–41)
Albumin: 3.6 g/dL (ref 3.5–5.0)
Alkaline Phosphatase: 46 U/L (ref 38–126)
Anion gap: 5 (ref 5–15)
BUN: 24 mg/dL — ABNORMAL HIGH (ref 8–23)
CO2: 27 mmol/L (ref 22–32)
Calcium: 9 mg/dL (ref 8.9–10.3)
Chloride: 105 mmol/L (ref 98–111)
Creatinine, Ser: 1.1 mg/dL (ref 0.61–1.24)
GFR, Estimated: >60 mL/min (ref >60)
Glucose, Bld: 140 mg/dL — ABNORMAL HIGH (ref 70–99)
Potassium: 4.2 mmol/L (ref 3.5–5.1)
Sodium: 137 mmol/L (ref 135–145)
Total Bilirubin: 0.9 mg/dL (ref 0.3–1.2)
Total Protein: 6.1 g/dL — ABNORMAL LOW (ref 6.5–8.1)

## 2021-08-14 LAB — CBC
HCT: 26.6 % — ABNORMAL LOW (ref 39.0–52.0)
Hemoglobin: 8.3 g/dL — ABNORMAL LOW (ref 13.0–17.0)
MCH: 31 pg (ref 26.0–34.0)
MCHC: 31.2 g/dL (ref 30.0–36.0)
MCV: 99.3 fL (ref 80.0–100.0)
Platelets: 131 10*3/uL — ABNORMAL LOW (ref 150–400)
RBC: 2.68 MIL/uL — ABNORMAL LOW (ref 4.22–5.81)
RDW: 16.4 % — ABNORMAL HIGH (ref 11.5–15.5)
WBC: 0.9 10*3/uL — CL (ref 4.0–10.5)
nRBC: 0 % (ref 0.0–0.2)

## 2021-08-14 NOTE — ED Triage Notes (Signed)
Pt to ED via POV with c/o lower abd pain and nausea. Pt denies urinary symptoms at this time. Pt A&O x4. Pt states pain is intermittent at this time.    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 Pt states difficulty urinating at baseline.

## 2021-08-15 ENCOUNTER — Inpatient Hospital Stay: Payer: Medicare HMO

## 2021-08-15 ENCOUNTER — Inpatient Hospital Stay
Admission: EM | Admit: 2021-08-15 | Discharge: 2021-08-18 | DRG: 844 | Disposition: A | Discharge status: Hospice | Payer: Medicare HMO | Attending: Internal Medicine | Admitting: Internal Medicine

## 2021-08-15 ENCOUNTER — Emergency Department: Payer: Medicare HMO

## 2021-08-15 DIAGNOSIS — N281 Cyst of kidney, acquired: Secondary | ICD-10-CM | POA: Diagnosis not present

## 2021-08-15 DIAGNOSIS — D61818 Other pancytopenia: Secondary | ICD-10-CM | POA: Diagnosis not present

## 2021-08-15 DIAGNOSIS — Z515 Encounter for palliative care: Secondary | ICD-10-CM | POA: Diagnosis not present

## 2021-08-15 DIAGNOSIS — K5730 Diverticulosis of large intestine without perforation or abscess without bleeding: Secondary | ICD-10-CM | POA: Diagnosis not present

## 2021-08-15 DIAGNOSIS — C61 Malignant neoplasm of prostate: Secondary | ICD-10-CM | POA: Diagnosis present

## 2021-08-15 DIAGNOSIS — R600 Localized edema: Secondary | ICD-10-CM | POA: Diagnosis not present

## 2021-08-15 DIAGNOSIS — N329 Bladder disorder, unspecified: Secondary | ICD-10-CM | POA: Diagnosis not present

## 2021-08-15 DIAGNOSIS — Z7189 Other specified counseling: Secondary | ICD-10-CM | POA: Diagnosis not present

## 2021-08-15 DIAGNOSIS — N3000 Acute cystitis without hematuria: Secondary | ICD-10-CM

## 2021-08-15 DIAGNOSIS — C7989 Secondary malignant neoplasm of other specified sites: Secondary | ICD-10-CM | POA: Diagnosis not present

## 2021-08-15 DIAGNOSIS — M1990 Unspecified osteoarthritis, unspecified site: Secondary | ICD-10-CM | POA: Diagnosis not present

## 2021-08-15 DIAGNOSIS — E291 Testicular hypofunction: Secondary | ICD-10-CM | POA: Diagnosis not present

## 2021-08-15 DIAGNOSIS — N390 Urinary tract infection, site not specified: Secondary | ICD-10-CM | POA: Diagnosis present

## 2021-08-15 DIAGNOSIS — I700 Atherosclerosis of aorta: Secondary | ICD-10-CM | POA: Diagnosis not present

## 2021-08-15 DIAGNOSIS — N3289 Other specified disorders of bladder: Secondary | ICD-10-CM | POA: Diagnosis present

## 2021-08-15 DIAGNOSIS — R509 Fever, unspecified: Secondary | ICD-10-CM | POA: Diagnosis not present

## 2021-08-15 DIAGNOSIS — N2889 Other specified disorders of kidney and ureter: Secondary | ICD-10-CM | POA: Diagnosis not present

## 2021-08-15 DIAGNOSIS — J9811 Atelectasis: Secondary | ICD-10-CM | POA: Diagnosis not present

## 2021-08-15 DIAGNOSIS — D539 Nutritional anemia, unspecified: Secondary | ICD-10-CM | POA: Diagnosis not present

## 2021-08-15 DIAGNOSIS — K56609 Unspecified intestinal obstruction, unspecified as to partial versus complete obstruction: Secondary | ICD-10-CM | POA: Diagnosis not present

## 2021-08-15 DIAGNOSIS — N2882 Megaloureter: Secondary | ICD-10-CM | POA: Diagnosis not present

## 2021-08-15 DIAGNOSIS — E785 Hyperlipidemia, unspecified: Secondary | ICD-10-CM | POA: Diagnosis present

## 2021-08-15 DIAGNOSIS — N136 Pyonephrosis: Secondary | ICD-10-CM | POA: Diagnosis not present

## 2021-08-15 DIAGNOSIS — K6389 Other specified diseases of intestine: Secondary | ICD-10-CM | POA: Diagnosis not present

## 2021-08-15 DIAGNOSIS — R32 Unspecified urinary incontinence: Secondary | ICD-10-CM | POA: Diagnosis not present

## 2021-08-15 DIAGNOSIS — M8580 Other specified disorders of bone density and structure, unspecified site: Secondary | ICD-10-CM | POA: Diagnosis not present

## 2021-08-15 DIAGNOSIS — Z79899 Other long term (current) drug therapy: Secondary | ICD-10-CM | POA: Diagnosis not present

## 2021-08-15 DIAGNOSIS — K7689 Other specified diseases of liver: Secondary | ICD-10-CM | POA: Diagnosis not present

## 2021-08-15 DIAGNOSIS — I712 Thoracic aortic aneurysm, without rupture, unspecified: Secondary | ICD-10-CM | POA: Diagnosis not present

## 2021-08-15 DIAGNOSIS — Z66 Do not resuscitate: Secondary | ICD-10-CM | POA: Diagnosis not present

## 2021-08-15 DIAGNOSIS — Z809 Family history of malignant neoplasm, unspecified: Secondary | ICD-10-CM | POA: Diagnosis not present

## 2021-08-15 DIAGNOSIS — E669 Obesity, unspecified: Secondary | ICD-10-CM | POA: Diagnosis not present

## 2021-08-15 DIAGNOSIS — R1030 Lower abdominal pain, unspecified: Secondary | ICD-10-CM | POA: Diagnosis not present

## 2021-08-15 DIAGNOSIS — Z6831 Body mass index (BMI) 31.0-31.9, adult: Secondary | ICD-10-CM | POA: Diagnosis not present

## 2021-08-15 LAB — CBC WITH DIFFERENTIAL/PLATELET
Abs Immature Granulocytes: 0.01 10*3/uL (ref 0.00–0.07)
Basophils Absolute: 0 10*3/uL (ref 0.0–0.1)
Basophils Relative: 0 %
Eosinophils Absolute: 0 10*3/uL (ref 0.0–0.5)
Eosinophils Relative: 3 %
HCT: 26.4 % — ABNORMAL LOW (ref 39.0–52.0)
Hemoglobin: 8.4 g/dL — ABNORMAL LOW (ref 13.0–17.0)
Immature Granulocytes: 1 %
Lymphocytes Relative: 38 %
Lymphs Abs: 0.3 10*3/uL — ABNORMAL LOW (ref 0.7–4.0)
MCH: 31.2 pg (ref 26.0–34.0)
MCHC: 31.8 g/dL (ref 30.0–36.0)
MCV: 98.1 fL (ref 80.0–100.0)
Monocytes Absolute: 0.1 10*3/uL (ref 0.1–1.0)
Monocytes Relative: 11 %
Neutro Abs: 0.4 10*3/uL — CL (ref 1.7–7.7)
Neutrophils Relative %: 47 %
Platelets: 133 10*3/uL — ABNORMAL LOW (ref 150–400)
RBC: 2.69 MIL/uL — ABNORMAL LOW (ref 4.22–5.81)
RDW: 16.3 % — ABNORMAL HIGH (ref 11.5–15.5)
WBC: 0.9 10*3/uL — CL (ref 4.0–10.5)
nRBC: 2.2 % — ABNORMAL HIGH (ref 0.0–0.2)

## 2021-08-15 LAB — URINALYSIS, ROUTINE W REFLEX MICROSCOPIC
Bilirubin Urine: NEGATIVE
Glucose, UA: NEGATIVE mg/dL
Hgb urine dipstick: NEGATIVE
Ketones, ur: 5 mg/dL — AB
Leukocytes,Ua: NEGATIVE
Nitrite: NEGATIVE
Protein, ur: 100 mg/dL — AB
Specific Gravity, Urine: 1.026 (ref 1.005–1.030)
pH: 5 (ref 5.0–8.0)

## 2021-08-15 LAB — PROTIME-INR
INR: 1.2 (ref 0.8–1.2)
Prothrombin Time: 14.8 seconds (ref 11.4–15.2)

## 2021-08-15 LAB — APTT: aPTT: 47 seconds — ABNORMAL HIGH (ref 24–36)

## 2021-08-15 MED ORDER — ASCORBIC ACID 500 MG PO TABS
1000.0000 mg | ORAL_TABLET | Freq: Every day | ORAL | Status: DC
  Administered 2021-08-15: 1000 mg via ORAL
  Filled 2021-08-15 (×2): qty 2

## 2021-08-15 MED ORDER — ONDANSETRON HCL 4 MG PO TABS: 4.0000 mg | ORAL_TABLET | Freq: Four times a day (QID) | ORAL | Status: DC | PRN

## 2021-08-15 MED ORDER — MORPHINE SULFATE (PF) 2 MG/ML IV SOLN
1.0000 mg | INTRAVENOUS | Status: DC | PRN
  Administered 2021-08-15 (×2): 1 mg via INTRAVENOUS
  Filled 2021-08-15 (×2): qty 1

## 2021-08-15 MED ORDER — SODIUM CHLORIDE 0.9 % IV SOLN
1.0000 g | Freq: Once | INTRAVENOUS | Status: AC
  Administered 2021-08-15: 1 g via INTRAVENOUS
  Filled 2021-08-15: qty 10

## 2021-08-15 MED ORDER — ADULT MULTIVITAMIN W/MINERALS CH
1.0000 | ORAL_TABLET | Freq: Every day | ORAL | Status: DC
  Administered 2021-08-15: 1 via ORAL
  Filled 2021-08-15 (×2): qty 1

## 2021-08-15 MED ORDER — ONDANSETRON HCL 4 MG/2ML IJ SOLN
4.0000 mg | Freq: Four times a day (QID) | INTRAMUSCULAR | Status: DC | PRN
  Administered 2021-08-15 (×3): 4 mg via INTRAVENOUS
  Filled 2021-08-15 (×3): qty 2

## 2021-08-15 MED ORDER — IOHEXOL 300 MG/ML  SOLN
100.0000 mL | Freq: Once | INTRAMUSCULAR | Status: AC | PRN
  Administered 2021-08-15: 100 mL via INTRAVENOUS

## 2021-08-15 MED ORDER — SODIUM CHLORIDE 0.9 % IV SOLN: INTRAVENOUS | Status: DC

## 2021-08-15 MED ORDER — SODIUM CHLORIDE 0.9 % IV BOLUS
1000.0000 mL | Freq: Once | INTRAVENOUS | Status: AC
  Administered 2021-08-15: 1000 mL via INTRAVENOUS

## 2021-08-15 MED ORDER — MAGNESIUM HYDROXIDE 400 MG/5ML PO SUSP: 30.0000 mL | Freq: Every day | ORAL | Status: DC | PRN

## 2021-08-15 MED ORDER — HEPARIN SODIUM (PORCINE) 5000 UNIT/ML IJ SOLN
5000.0000 [IU] | Freq: Three times a day (TID) | INTRAMUSCULAR | Status: DC
  Administered 2021-08-15 – 2021-08-16 (×5): 5000 [IU] via SUBCUTANEOUS
  Filled 2021-08-15 (×5): qty 1

## 2021-08-15 MED ORDER — MORPHINE SULFATE (PF) 2 MG/ML IV SOLN
2.0000 mg | INTRAVENOUS | Status: DC | PRN
  Administered 2021-08-15: 2 mg via INTRAVENOUS
  Filled 2021-08-15: qty 1

## 2021-08-15 MED ORDER — ORAL CARE MOUTH RINSE
15.0000 mL | OROMUCOSAL | Status: DC | PRN
Start: 2021-08-16 — End: 2021-08-18

## 2021-08-15 MED ORDER — SODIUM CHLORIDE 0.9 % IV SOLN
1.0000 g | INTRAVENOUS | Status: DC
  Administered 2021-08-16 – 2021-08-18 (×3): 1 g via INTRAVENOUS
  Filled 2021-08-15 (×2): qty 1
  Filled 2021-08-15: qty 10

## 2021-08-15 MED ORDER — MORPHINE SULFATE (PF) 4 MG/ML IV SOLN
4.0000 mg | Freq: Once | INTRAVENOUS | Status: AC
  Administered 2021-08-15: 4 mg via INTRAVENOUS
  Filled 2021-08-15: qty 1

## 2021-08-15 MED ORDER — TRAZODONE HCL 50 MG PO TABS
25.0000 mg | ORAL_TABLET | Freq: Every evening | ORAL | Status: DC | PRN
  Administered 2021-08-17: 25 mg via ORAL
  Filled 2021-08-15: qty 1

## 2021-08-15 MED ORDER — ACETAMINOPHEN 650 MG RE SUPP: 650.0000 mg | Freq: Four times a day (QID) | RECTAL | Status: DC | PRN

## 2021-08-15 MED ORDER — ENOXAPARIN SODIUM 60 MG/0.6ML IJ SOSY
0.5000 mg/kg | PREFILLED_SYRINGE | INTRAMUSCULAR | Status: DC
  Administered 2021-08-15: 52.5 mg via SUBCUTANEOUS
  Filled 2021-08-15: qty 0.6

## 2021-08-15 MED ORDER — VITAMIN D 25 MCG (1000 UNIT) PO TABS
5000.0000 [IU] | ORAL_TABLET | Freq: Every day | ORAL | Status: DC
  Administered 2021-08-15: 5000 [IU] via ORAL
  Filled 2021-08-15: qty 5

## 2021-08-15 MED ORDER — ROSUVASTATIN CALCIUM 10 MG PO TABS
20.0000 mg | ORAL_TABLET | Freq: Every day | ORAL | Status: DC
  Filled 2021-08-15: qty 1
  Filled 2021-08-15: qty 2

## 2021-08-15 MED ORDER — ACETAMINOPHEN 325 MG PO TABS: 650.0000 mg | ORAL_TABLET | Freq: Four times a day (QID) | ORAL | Status: DC | PRN

## 2021-08-15 NOTE — Assessment & Plan Note (Signed)
Patient is getting blood transfusion every 2 weeks.  WBC 0.9, hemoglobin 8.4 which was 6.5 on 08/03/2021, platelet of 133.  No active bleeding. Per his oncologist, Dr. Toy Cookey clinic note on 6/29, "The patient has not started filgrastim injections at this time. He has an appointment with Dr. Berline Chough in Malignant Hematology on 08/17/2021 for further evaluation".  -Follow-up with CBC -Neutropenia precaution

## 2021-08-15 NOTE — Assessment & Plan Note (Addendum)
Pt has new bladder mass.  -Pt has an appointment with Dr. Tresa Endo (Urology) on 08/22/2021.

## 2021-08-15 NOTE — Consult Note (Signed)
[facility] SURGICAL CONSULTATION NOTE (initial) - cpt: 08144   HISTORY OF PRESENT ILLNESS (HPI):  86 y.o. male presented to [facility] ED overnight for evaluation of abdominal pain. Patient reports he has had intermittent lower abdominal pain over the last 6 weeks, but this has acutely worsened in the 24-48 hours. He thought this was related to constipated and took stool softeners. He had loose Bm but this  did not relieve his pain. No fever, chills, nausea, emesis, CP, or SOB. Of note, he does have a history of metastatic prostate CA with pancytopenia. Follows at [facility] and receives bi-weekly transfusions. He reports that he can "no longer have any treatment because of his low counts." Also with known bladder mass. Work up in the ED revealed known leukopenia to 0.9K, Hgbto 8.3, PLT to 131K, renal function normal with sCr - 1.10, no significant electrolyte derangements. CT Abdomen/Pelvis was concerning for SBO most likely malignant in nature secondary to mesenteric masses. He was admitted to the medicine service. NGT was withheld as he was without any nausea/emesis.   Surgery is consulted by emergency medicine physician Dr. Acquanetta Belling, MD in this context for evaluation and management of SBO.  PAST MEDICAL HISTORY (PMH):  Past Medical History:  Diagnosis Date   Anemia    Atherosclerosis    Back pain    High coronary artery calcium score    Hyperlipidemia    Hypogonadism in male    Lymphadenopathy    OA (osteoarthritis)    Obesity    Osteopenia    Prostate cancer ([place])    Prostate cancer metastatic to pelvis (HCC)    Rash, skin    LEG   Thoracic aortic aneurysm (TAA) (HCC)      PAST SURGICAL HISTORY ([place]):  Past Surgical History:  Procedure Laterality Date   BELPHAROPTOSIS REPAIR     BILATERAL KNEE ARTHROSCOPY     HYDRONEPHROSIS Right    LYMPHADENOPATHY Right      MEDICATIONS:  Prior to Admission medications   Medication Sig Start Date End Date Taking? Authorizing  Provider  Ascorbic Acid (VITAMIN C) 1000 MG tablet Take 1,000 mg by mouth daily.   Yes [provider]  Cholecalciferol 125 MCG (5000 UT) TABS Take 1 tablet by mouth daily.   Yes [provider]  Multiple Vitamin (MULTIVITAMIN) capsule Take 1 capsule by mouth daily.   Yes [provider]  rosuvastatin (CRESTOR) 20 MG tablet Take 20 mg by mouth.   Yes [provider]     ALLERGIES:  Allergies  Allergen Reactions   Simvastatin Other (See Comments)    myalgia     SOCIAL HISTORY:  Social History   Socioeconomic History   Marital status: Married    Spouse name: Not on file   Number of children: Not on file   Years of education: Not on file   Highest education level: Not on file  Occupational History   Not on file  Tobacco Use   Smoking status: Never   Smokeless tobacco: Never  Substance and Sexual Activity   Alcohol use: No    Alcohol/week: 0.0 standard drinks of alcohol   Drug use: Not on file   Sexual activity: Not on file  Other Topics Concern   Not on file  Social History Narrative   Not on file   Social Determinants of Health   Financial Resource Strain: Not on file  Food Insecurity: Not on file  Transportation Needs: Not on file  Physical Activity:  Not on file  Stress: Not on file  Social Connections: Not on file  Intimate Partner Violence: Not on file     FAMILY HISTORY:  Family History  Problem Relation Age of Onset   Vascular Disease Mother    Cancer Mother    Heart attack Father    Diabetes Father    Healthy Sister    Heart attack Brother    Diabetes Paternal Grandmother    Other Paternal Grandfather        OLD AGE   Healthy Brother    Healthy Daughter       REVIEW OF SYSTEMS:  Review of Systems  Constitutional:  Negative for chills and fever.  HENT:  Negative for congestion and sore throat.   Respiratory:  Negative for cough and shortness of breath.   Cardiovascular:  Negative for chest pain and  palpitations.  Gastrointestinal:  Positive for abdominal pain. Negative for constipation, diarrhea, nausea and vomiting.  Genitourinary:  Negative for dysuria and urgency.  All other systems reviewed and are negative.   VITAL SIGNS:  Temp:  [98.6 F (37 C)] 98.6 F (37 C) (07/04 2139) Pulse Rate:  [72-88] 74 (07/05 0700) Resp:  [13-20] 16 (07/05 0700) BP: (98-146)/(60-80) 111/60 (07/05 0700) SpO2:  [90 %-100 %] 97 % (07/05 0700) Weight:  [104.3 kg] 104.3 kg (07/04 2140)     Height: 6' (182.9 cm) Weight: 104.3 kg BMI (Calculated): 31.19   INTAKE/OUTPUT:  07/04 0701 - 07/05 0700 In: 100.1 [IV Piggyback:100.1] Out: -   PHYSICAL EXAM:  Physical Exam Vitals and nursing note reviewed. Exam conducted with a chaperone present.  Constitutional:      General: He is not in acute distress.    Appearance: He is well-developed. He is obese. He is not ill-appearing.     Comments: Patient resting in bed, NAD  HENT:     Head: Normocephalic and atraumatic.  Cardiovascular:     Rate and Rhythm: Normal rate and regular rhythm.  Pulmonary:     Effort: Pulmonary effort is normal. No respiratory distress.  Abdominal:     General: Abdomen is protuberant. A surgical scar is present. There is no distension.     Tenderness: There is no abdominal tenderness. There is no guarding or rebound.     Hernia: No hernia is present.     Comments: Abdomen is soft, non-tender, does not appear distended, no rebound/guarding  Genitourinary:    Comments: Deferred Skin:    General: Skin is warm and dry.     Findings: No erythema or rash.  Neurological:     General: No focal deficit present.     Mental Status: He is alert and oriented to person, place, and time.  Psychiatric:        Mood and Affect: Mood normal.        Behavior: Behavior normal.      Labs:     Latest Ref Rng & Units 08/14/2021    9:41 PM 08/03/2021    2:20 PM 11/15/2015    5:06 AM  CBC  WBC 4.0 - 10.5 K/uL 4.0 - 10.5 K/uL 0.9    0.9   0.8  6.3   Hemoglobin 13.0 - 17.0 g/dL 13.0 - 17.0 g/dL 8.3    8.4  6.5  10.7   Hematocrit 39.0 - 52.0 % 39.0 - 52.0 % 26.6    26.4  19.7  33.5   Platelets 150 - 400 K/uL 150 - 400 K/uL 131  133  123  109       Latest Ref Rng & Units 08/14/2021    9:41 PM 08/03/2021    2:20 PM 11/15/2015    5:06 AM  CMP  Glucose 70 - 99 mg/dL 140  124  127   BUN 8 - 23 mg/dL '24  20  19   '$ Creatinine 0.61 - 1.24 mg/dL 1.10  1.13  0.81   Sodium 135 - 145 mmol/L 137  136  136   Potassium 3.5 - 5.1 mmol/L 4.2  4.2  3.8   Chloride 98 - 111 mmol/L 105  102  105   CO2 22 - 32 mmol/L '27  27  24   '$ Calcium 8.9 - 10.3 mg/dL 9.0  9.1  8.4   Total Protein 6.5 - 8.1 g/dL 6.1  5.6  5.6   Total Bilirubin 0.3 - 1.2 mg/dL 0.9  0.6  1.1   Alkaline Phos 38 - 126 U/L 46  37  38   AST 15 - 41 U/L 35  23  10   ALT 0 - 44 U/L 41  34  9      Imaging studies:   CT Abdomen/Pelvis (08/15/2021) personally reviewed which shows dilated small bowel with distal transition point, there appears to be mesenteric masses in this area, known bladder mass seen, and radiologist report reviewed below:  IMPRESSION: 1. Distal small bowel obstruction with a transition zone seen within the anterior aspect of the lower abdomen, along the midline. 2. Stable marked severity right-sided hydronephrosis and hydroureter with dilatation of the intrarenal calices. 3. Stable lobulated urinary bladder mass consistent with an underlying neoplasm. Further evaluation with soft tissue sampling is recommended. 4. Sigmoid diverticulosis. 5. Stable pelvic and right inguinal lymphadenopathy. 6. Grade 2 anterolisthesis of the L4 vertebral body on L5. 7. Low attenuation splenic lesions which may represent splenic hemangiomas. An underlying neoplastic process cannot be excluded. Further evaluation with nuclear medicine PET/CT is recommended. 8. Aortic atherosclerosis.   Assessment/Plan: (ICD-10's: K65.609) 86 y.o. male with lower abdominal pain and  now resolved nausea found to have dilated small bowel with distal transition point with mesenteric masses in this area which is concerning for malignant obstruction, complicated by pertinent comorbidities including history of prostate CA, bladder masses, and pancytopenia.   - Appreciate medicine admission  - This is a rather unfortunate situation without good answers from a surgical perspective. It does appear that his bowel obstruction is malignant in nature secondary to masses in the mesentery right at the transition point which may (or may not) represent progression of his disease process. Surgical resection is unlikely to be a viable option as these masses create significant inflammation and adhesions (ex: frozen abdomen), are difficult to resect, has a high risk of complication, and ultimately does not treat his underlying issue which is likely this malignancy. The best option in this case would be supportive care and involving oncology + palliation.   - Recommend NPO for now; He has passed flatus which is encouraging.   - Can hold off on NGT for now  - Monitor abdominal examination; on-going bowel function  - Can consider serial KUBs  - Pain control prn; antiemetics prn - Further management per primary service; we will follow    All of the above findings and recommendations were discussed with the patient, and all of patient's questions were answered to his expressed satisfaction.  Thank you for the opportunity to participate in this patient's care.   -- Alroy Dust  Freda Jackson [facility] 08/15/2021, 7:36 AM M-F: 7am - 4pm

## 2021-08-15 NOTE — Progress Notes (Signed)
Anticoagulation monitoring(Lovenox):  86 yo male ordered Lovenox 40 mg Q24h    Filed Weights   08/14/21 2140  Weight: 104.3 kg (230 lb)   BMI  31.2  Lab Results  Component Value Date   CREATININE 1.10 08/14/2021   CREATININE 1.13 08/03/2021   CREATININE 0.81 11/15/2015   Estimated Creatinine Clearance: 62.4 mL/min (by C-G formula based on SCr of 1.1 mg/dL). Hemoglobin & Hematocrit     Component Value Date/Time   HGB 8.3 (L) 08/14/2021 2141   HGB 8.4 (L) 08/14/2021 2141   HCT 26.6 (L) 08/14/2021 2141   HCT 26.4 (L) 08/14/2021 2141     Per Protocol for Patient with estCrcl > 30 ml/min and BMI > 30, will transition to Lovenox 52.5 mg Q24h.

## 2021-08-15 NOTE — ED Notes (Signed)
Pt is very pale which can be attributed to Chemo. Pt endorses pain for 6 weeks in abd. Pt has been seen for same pain. Pt also received blood transfusions every 2 weeks for low hgb. Pt's labs determine he is not in need at this time

## 2021-08-15 NOTE — ED Notes (Signed)
Pt's desatting so 2lpm O2 added.

## 2021-08-15 NOTE — ED Notes (Signed)
Claiborne Billings, Daughter, [phone]

## 2021-08-15 NOTE — Assessment & Plan Note (Signed)
-  see above 

## 2021-08-15 NOTE — ED Provider Notes (Signed)
[facility] Provider Note    Event Date/Time   First MD Initiated Contact with Patient 08/15/21 0045     (approximate)   History   Abdominal Pain   HPI  Nicolas Woodward is a 86 y.o. male past medical history of metastatic prostate cancer with pancytopenia requiring packed red cell transfusion every 2 weeks who presents with abdominal pain.  Patient says that he has had this pain on and off for several weeks.  It is located in the bilateral lower quadrants and suprapubic region.  He has chronic urinary incontinence is not new for him denies any hematuria.  Was feeling constipated yesterday so took a stool softener had 2 BMs today.  BMs are loose but has not had diarrhea and denies nausea vomiting no fevers.  Patient has a known new bladder mass that is likely metastatic prostate cancer.  He is not currently on chemotherapy because of his neutropenia and is scheduled to see a new hematologist this week.  He was seen at [facility] last week with similar abdominal pain, CT showed likely progression of disease and right-sided hydronephrosis however patient did not want to provide a urine specimen.  He was neutropenic and anemic at this time received blood transfusion.  Denies cough congestion shortness of breath.     Past Medical History:  Diagnosis Date   Anemia    Atherosclerosis    Back pain    High coronary artery calcium score    Hyperlipidemia    Hypogonadism in male    Lymphadenopathy    OA (osteoarthritis)    Obesity    Osteopenia    Prostate cancer ([place])    Prostate cancer metastatic to pelvis (HCC)    Rash, skin    LEG   Thoracic aortic aneurysm (TAA) ([place])     Patient Active Problem List   Diagnosis Date Noted   Thrombocytopenia ([place]) 11/15/2015   Pulmonary embolism ([place]) 11/14/2015   Pleuritic chest pain 11/14/2015   RUQ abdominal pain 11/14/2015   History of prostate cancer 11/14/2015   Plantar fasciitis of right foot 07/05/2015   Ingrown  nail 05/16/2015   Paronychia of great toe, left 05/16/2015     Physical Exam  Triage Vital Signs: ED Triage Vitals  Enc Vitals Group     BP 08/14/21 2139 98/69     Pulse Rate 08/14/21 2139 88     Resp 08/14/21 2139 18     Temp 08/14/21 2139 98.6 F (37 C)     Temp Source 08/14/21 2139 Oral     SpO2 08/14/21 2139 98 %     Weight 08/14/21 2140 230 lb (104.3 kg)     Height 08/14/21 2140 6' (1.829 m)     Head Circumference --      Peak Flow --      Pain Score 08/14/21 2211 9     Pain Loc --      Pain Edu? --      Excl. in [place]? --     Most recent vital signs: Vitals:   08/15/21 0115 08/15/21 0145  BP:    Pulse:  78  Resp: 14 15  Temp:    SpO2:  96%     General: Awake, no distress.  Pale CV:  Good peripheral perfusion.  Resp:  Normal effort.  Abd:  No distention.  Mild tenderness in the left lower quadrant suprapubic region Neuro:  Awake, Alert, Oriented x 3  Other:     ED Results / Procedures / Treatments  Labs (all labs ordered are listed, but only abnormal results are displayed) Labs Reviewed  COMPREHENSIVE METABOLIC PANEL - Abnormal; Notable for the following components:      Result Value   Glucose, Bld 140 (*)    BUN 24 (*)    Total Protein 6.1 (*)    All other components within normal limits  CBC - Abnormal; Notable for the following components:   WBC 0.9 (*)    RBC 2.68 (*)    Hemoglobin 8.3 (*)    HCT 26.6 (*)    RDW 16.4 (*)    Platelets 131 (*)    All other components within normal limits  URINALYSIS, ROUTINE W REFLEX MICROSCOPIC - Abnormal; Notable for the following components:   Color, Urine AMBER (*)    APPearance HAZY (*)    Ketones, ur 5 (*)    Protein, ur 100 (*)    Bacteria, UA RARE (*)    All other components within normal limits  CBC WITH DIFFERENTIAL/PLATELET - Abnormal; Notable for the following components:   WBC 0.9 (*)    RBC 2.69 (*)    Hemoglobin 8.4 (*)    HCT 26.4 (*)    RDW 16.3 (*)    Platelets 133 (*)     nRBC 2.2 (*)    Neutro Abs 0.4 (*)    Lymphs Abs 0.3 (*)    All other components within normal limits  URINE CULTURE  CULTURE, BLOOD (ROUTINE X 2)  CULTURE, BLOOD (ROUTINE X 2)  LIPASE, BLOOD     EKG     RADIOLOGY CT abdomen pelvis interpreted by myself shows no free air   PROCEDURES:  Critical Care performed: No  Procedures  The patient is on the cardiac monitor to evaluate for evidence of arrhythmia and/or significant heart rate changes.   MEDICATIONS ORDERED IN ED: Medications  cefTRIAXone (ROCEPHIN) 1 g in sodium chloride 0.9 % 100 mL IVPB (1 g Intravenous New Bag/Given 08/15/21 0227)  sodium chloride 0.9 % bolus 1,000 mL (has no administration in time range)  morphine (PF) 4 MG/ML injection 4 mg (4 mg Intravenous Given 08/15/21 0119)  iohexol (OMNIPAQUE) 300 MG/ML solution 100 mL (100 mLs Intravenous Contrast Given 08/15/21 0127)     IMPRESSION / MDM / ASSESSMENT AND PLAN / ED COURSE  I reviewed the triage vital signs and the nursing notes.                              Patient's presentation is most consistent with acute presentation with potential threat to life or bodily function.  Differential diagnosis includes, but is not limited to, pain related to bladder mass/metastatic prostate cancer, constipation, diverticulitis, typhlitis, UTI, bowel obstruction  The patient is an 86 year old male with metastatic prostate cancer with chronic anemia and neutropenia transfusion dependent presenting with abdominal pain.  The abdominal pain has been going on for several weeks intermittent but worsened today.  He has no urinary symptoms other than his chronic urinary incontinence.  No fevers at home slightly constipated but no vomiting or nausea.  His vitals are within normal limits he is afebrile with normal blood pressure.  He appears chronically ill but nontoxic abdomen is mildly tender primarily in the left lower quadrant and suprapubic region.  His labs show a white count of  0.9, will add on a  differential to see his ANC.  BMP is largely unremarkable.  Lipase is normal.  I reviewed the CT from last week which shows multiple soft tissue masses and bladder mass likely progression of disease.  There were no acute findings. Patient is at high risk for infection will repeat the CT with contrast today.    CT abdomen pelvis shows SBO with transition point.  I discussed with Dr. Hampton Abbot who thinks it is likely has metastatic disease which is causing this.  Recommend supportive care with IV fluids pain control antiemetics and keeping patient n.p.o. for likely surgery will not be an option.  Patient's pain is relatively well controlled right now is not having nausea vomiting no indication for NG tube.  Patient's UA does have some pyuria and with his neutropenia we will treat with ceftriaxone.  Blood cultures urine culture will be sent.  Will discuss with hospice for admission.     FINAL CLINICAL IMPRESSION(S) / ED DIAGNOSES   Final diagnoses:  SBO (small bowel obstruction) ([place])     Rx / DC Orders   ED Discharge Orders     None        Note:  This document was prepared using Dragon voice recognition software and may include unintentional dictation errors.   Rada Hay, MD 08/15/21 [phone]

## 2021-08-15 NOTE — Assessment & Plan Note (Addendum)
Consulted Dr. Hampton Abbot for general surgery.  Patient currently does not have active nausea and vomiting.  Dr. Hampton Abbot recommended conservative treatment now.  -Admitted to MedSurg bed as inpatient -Hold off NG tube now since patient does not have active nausea vomiting. -As needed Zofran, morphine -IV fluid: 1 L normal saline, followed by 100 cc/h -Palliative consult per Dr. Mont Dutton recommendation

## 2021-08-15 NOTE — H&P (Signed)
History and Physical    Nicolas Woodward XNT:[id number] DOB: 1935-05-22 DOA: 08/15/2021  Referring MD/NP/PA:   PCP: Ginger Organ., MD   Patient coming from:  The patient is coming from home.  At baseline, pt is independent for most of ADL.        Chief Complaint: Abdominal pain  HPI: Nicolas Woodward is a 86 y.o. male with medical history significant of metastatic prostate cancer with bladder mass following up with urologsit, pancytopenia requiring packed red cell transfusion every 2 weeks, hyperlipidemia, thoracic aortic aneurysm, who presents with abdominal pain.  Patient states that his abdominal pain has been going on for 3 days, which is located in lower abdomen, moderate, sharp, intermittent, nonradiating.  Not associated with nausea vomiting or diarrhea.  Denies chest pain, cough, shortness breath.  No fever or chills.  Patient has chronic urinary incontinence, no burning on urination, dysuria or hematuria.  Of note, patient has history of metastasized prostate cancer, with recently found bladder mass.  Patient has an appointment with urologist, Dr. Rosana Hoes at [facility] on 7/12.  Patient has chronic asymmetric leg edema, right leg is worse than the left.  Patient states that he had negative lower extremity venous Doppler for DVT on 04/03/21.  Data reviewed independently and ED Course: pt was found to have pancytopenia with WBC 0.9, hemoglobin 8.4, platelets 133, positive urinalysis (hazy appearance, negative leukocyte, rare bacteria, WBC 21-50), GFR> 60, temperature normal, soft blood pressure 98/69, heart rate 80, RR 16, oxygen saturation 96-97% on room air.  Patient is admitted to [facility] bed as inpatient.  CT abdomen/pelvis:  1. Distal small bowel obstruction with a transition zone seen within the anterior aspect of the lower abdomen, along the midline. 2. Stable marked severity right-sided hydronephrosis and hydroureter with dilatation of the intrarenal calices. 3. Stable  lobulated urinary bladder mass consistent with an underlying neoplasm. Further evaluation with soft tissue sampling is recommended. 4. Sigmoid diverticulosis. 5. Stable pelvic and right inguinal lymphadenopathy. 6. Grade 2 anterolisthesis of the L4 vertebral body on L5. 7. Low attenuation splenic lesions which may represent splenic hemangiomas. An underlying neoplastic process cannot be excluded. Further evaluation with nuclear medicine PET/CT is recommended. 8. Aortic atherosclerosis.   Aortic Atherosclerosis (ICD10-I70.0).     EKG:  Not done in ED, will get one.     Review of Systems:   General: no fevers, chills, no body weight gain, has poor appetite, has fatigue HEENT: no blurry vision, hearing changes or sore throat Respiratory: no dyspnea, coughing, wheezing CV: no chest pain, no palpitations GI: no nausea, vomiting, has abdominal pain, no diarrhea, constipation GU: no dysuria, burning on urination, increased urinary frequency, hematuria  Ext: has leg edema Neuro: no unilateral weakness, numbness, or tingling, no vision change or hearing loss Skin: no rash, no skin tear. MSK: No muscle spasm, no deformity, no limitation of range of movement in spin Heme: No easy bruising.  Travel history: No recent long distant travel.   Allergy:  Allergies  Allergen Reactions   Simvastatin Other (See Comments)    myalgia    Past Medical History:  Diagnosis Date   Anemia    Atherosclerosis    Back pain    High coronary artery calcium score    Hyperlipidemia    Hypogonadism in male    Lymphadenopathy    OA (osteoarthritis)    Obesity    Osteopenia    Prostate cancer (HCC)    Prostate cancer metastatic to pelvis (  [place])    Rash, skin    LEG   Thoracic aortic aneurysm (TAA) ([place])     Past Surgical History:  Procedure Laterality Date   BELPHAROPTOSIS REPAIR     BILATERAL KNEE ARTHROSCOPY     HYDRONEPHROSIS Right    LYMPHADENOPATHY Right     Social History:   reports that he has never smoked. He has never used smokeless tobacco. He reports that he does not drink alcohol. No history on file for drug use.  Family History:  Family History  Problem Relation Age of Onset   Vascular Disease Mother    Cancer Mother    Heart attack Father    Diabetes Father    Healthy Sister    Heart attack Brother    Diabetes Paternal Grandmother    Other Paternal Grandfather        OLD AGE   Healthy Brother    Healthy Daughter      Prior to Admission medications   Medication Sig Start Date End Date Taking? Authorizing Provider  Ascorbic Acid (VITAMIN C) 1000 MG tablet Take 1,000 mg by mouth daily.   Yes [provider]  Cholecalciferol 125 MCG (5000 UT) TABS Take 1 tablet by mouth daily.   Yes [provider]  Multiple Vitamin (MULTIVITAMIN) capsule Take 1 capsule by mouth daily.   Yes [provider]  rosuvastatin (CRESTOR) 20 MG tablet Take 20 mg by mouth.   Yes [provider]    Physical Exam: Vitals:   08/15/21 1000 08/15/21 1030 08/15/21 1238 08/15/21 1356  BP: (!) 101/49 106/64 (!) 103/59 (!) 118/50  Pulse: 70 70 69 73  Resp: '15 20 16 20  '$ Temp:   98 F (36.7 C) 98.3 F (36.8 C)  TempSrc:   Oral   SpO2: 98% 99% 100% 99%  Weight:      Height:       General: Not in acute distress HEENT:       Eyes: PERRL, EOMI, no scleral icterus.       ENT: No discharge from the ears and nose, no pharynx injection, no tonsillar enlargement.        Neck: No JVD, no bruit, no mass felt. Heme: No neck lymph node enlargement. Cardiac: S1/S2, RRR, No murmurs, No gallops or rubs. Respiratory: No rales, wheezing, rhonchi or rubs. GI: Soft, nondistended, mild tenderness in lower abdomen, no rebound pain, no organomegaly, BS present. GU: No hematuria Ext:  1+DP/PT pulse bilaterally.  Has 3+ right leg edema and 1+ left leg edema Musculoskeletal: No joint deformities, No joint redness or warmth, no limitation of ROM in  spin. Skin: No rashes.  Neuro: Alert, oriented X3, cranial nerves II-XII grossly intact, moves all extremities normally.  Psych: Patient is not psychotic, no suicidal or hemocidal ideation.  Labs on Admission: I have personally reviewed following labs and imaging studies  CBC: Recent Labs  Lab 08/14/21 2141  WBC 0.9*  0.9*  NEUTROABS 0.4*  HGB 8.4*  8.3*  HCT 26.4*  26.6*  MCV 98.1  99.3  PLT 133*  166*   Basic Metabolic Panel: Recent Labs  Lab 08/14/21 2141  NA 137  K 4.2  CL 105  CO2 27  GLUCOSE 140*  BUN 24*  CREATININE 1.10  CALCIUM 9.0   GFR: Estimated Creatinine Clearance: 62.4 mL/min (by C-G formula based on SCr of 1.1 mg/dL). Liver Function Tests: Recent Labs  Lab 08/14/21 2141  AST 35  ALT 41  ALKPHOS 46  BILITOT 0.9  PROT 6.1*  ALBUMIN 3.6   Recent Labs  Lab 08/14/21 2141  LIPASE 24   No results for input(s): "AMMONIA" in the last 168 hours. Coagulation Profile: No results for input(s): "INR", "PROTIME" in the last 168 hours. Cardiac Enzymes: No results for input(s): "CKTOTAL", "CKMB", "CKMBINDEX", "TROPONINI" in the last 168 hours. BNP (last 3 results) No results for input(s): "PROBNP" in the last 8760 hours. HbA1C: No results for input(s): "HGBA1C" in the last 72 hours. CBG: No results for input(s): "GLUCAP" in the last 168 hours. Lipid Profile: No results for input(s): "CHOL", "HDL", "LDLCALC", "TRIG", "CHOLHDL", "LDLDIRECT" in the last 72 hours. Thyroid Function Tests: No results for input(s): "TSH", "T4TOTAL", "FREET4", "T3FREE", "THYROIDAB" in the last 72 hours. Anemia Panel: No results for input(s): "VITAMINB12", "FOLATE", "FERRITIN", "TIBC", "IRON", "RETICCTPCT" in the last 72 hours. Urine analysis:    Component Value Date/Time   COLORURINE AMBER (A) 08/15/2021 0123   APPEARANCEUR HAZY (A) 08/15/2021 0123   LABSPEC 1.026 08/15/2021 0123   PHURINE 5.0 08/15/2021 0123   GLUCOSEU NEGATIVE 08/15/2021 0123   HGBUR NEGATIVE  08/15/2021 0123   BILIRUBINUR NEGATIVE 08/15/2021 0123   KETONESUR 5 (A) 08/15/2021 0123   PROTEINUR 100 (A) 08/15/2021 0123   NITRITE NEGATIVE 08/15/2021 0123   LEUKOCYTESUR NEGATIVE 08/15/2021 0123   Sepsis Labs: '@LABRCNTIP'$ (procalcitonin:4,lacticidven:4) ) Recent Results (from the past 240 hour(s))  Blood culture (routine x 2)     Status: None (Preliminary result)   Collection Time: 08/15/21  3:20 AM   Specimen: BLOOD  Result Value Ref Range Status   Specimen Description BLOOD LEFT WRIST  Final   Special Requests   Final    BOTTLES DRAWN AEROBIC AND ANAEROBIC Blood Culture results may not be optimal due to an inadequate volume of blood received in culture bottles   Culture   Final    NO GROWTH <12 HOURS Performed at [facility], [address], [place], [place] [address]    Report Status PENDING  Incomplete  Blood culture (routine x 2)     Status: None (Preliminary result)   Collection Time: 08/15/21  3:20 AM   Specimen: BLOOD  Result Value Ref Range Status   Specimen Description BLOOD RIGHT AC  Final   Special Requests   Final    BOTTLES DRAWN AEROBIC AND ANAEROBIC Blood Culture results may not be optimal due to an inadequate volume of blood received in culture bottles   Culture   Final    NO GROWTH <12 HOURS Performed at [facility], [address] [place], [place] [address]    Report Status PENDING  Incomplete     Radiological Exams on Admission: DG Abd 2 Views  Result Date: 08/15/2021 CLINICAL DATA:  Small-bowel obstruction EXAM: ABDOMEN - 2 VIEW COMPARISON:  None Available. FINDINGS: There are a few air-filled mildly dilated loops of small bowel. Likely similar in comparison to CT topogram. No free air. Residual contrast within dilated left renal collecting system and ureter. IMPRESSION: Persistent small-bowel obstruction with likely similar distension to CT. Residual contrast within dilated left renal collecting system and ureter. Electronically  Signed   By: Macy Mis M.D.   On: 08/15/2021 11:27   CT ABDOMEN PELVIS W CONTRAST  Result Date: 08/15/2021 CLINICAL DATA:  Left lower quadrant abdominal pain. EXAM: CT ABDOMEN AND PELVIS WITH CONTRAST TECHNIQUE: Multidetector CT imaging of the abdomen and pelvis was performed using the standard protocol following bolus administration of intravenous contrast. RADIATION DOSE REDUCTION: This  exam was performed according to the departmental dose-optimization program which includes automated exposure control, adjustment of the mA and/or kV according to patient size and/or use of iterative reconstruction technique. CONTRAST:  14m OMNIPAQUE IOHEXOL 300 MG/ML  SOLN COMPARISON:  August 03, 2021 FINDINGS: Lower chest: No acute abnormality. Hepatobiliary: Small, stable hepatic cysts are seen. No gallstones, gallbladder wall thickening, or biliary dilatation. Pancreas: Unremarkable. No pancreatic ductal dilatation or surrounding inflammatory changes. Spleen: Multiple small, stable heterogeneous low-attenuation splenic lesions are noted. Adrenals/Urinary Tract: The right adrenal gland is normal in appearance. Nodularity of the left adrenal gland is seen. The kidneys are normal in size, bilaterally. Marked severity diffuse renal cortical thinning is seen on the right. Small, stable simple cysts are seen within the left kidney. There is stable marked severity right-sided hydronephrosis and hydroureter with dilatation of the intrarenal calices. No renal calculi are identified. A stable approximately 9.5 cm x 5.7 cm heterogeneous lobulated soft tissue mass is seen within the posterior and lateral aspects of the urinary bladder. Stomach/Bowel: Stomach is within normal limits. The appendix is not clearly identified. Numerous dilated small bowel loops are seen within the abdomen and pelvis (maximum small bowel diameter of approximately 4.0 cm). An abrupt transition zone is seen within the anterior aspect of the lower abdomen,  along the midline (axial CT images 54 through 58, CT series 2). Noninflamed diverticula are seen throughout the sigmoid colon. Vascular/Lymphatic: Aortic atherosclerosis. Stable pelvic and right inguinal lymphadenopathy is seen. Reproductive: The prostate gland is surgically absent. Other: No abdominal wall hernia or abnormality. No abdominopelvic ascites. Musculoskeletal: There is grade 2 anterolisthesis of the L4 vertebral body on L5. Multilevel degenerative changes are seen throughout the remainder of the lumbar spine. IMPRESSION: 1. Distal small bowel obstruction with a transition zone seen within the anterior aspect of the lower abdomen, along the midline. 2. Stable marked severity right-sided hydronephrosis and hydroureter with dilatation of the intrarenal calices. 3. Stable lobulated urinary bladder mass consistent with an underlying neoplasm. Further evaluation with soft tissue sampling is recommended. 4. Sigmoid diverticulosis. 5. Stable pelvic and right inguinal lymphadenopathy. 6. Grade 2 anterolisthesis of the L4 vertebral body on L5. 7. Low attenuation splenic lesions which may represent splenic hemangiomas. An underlying neoplastic process cannot be excluded. Further evaluation with nuclear medicine PET/CT is recommended. 8. Aortic atherosclerosis. Aortic Atherosclerosis (ICD10-I70.0). Electronically Signed   By: TVirgina NorfolkM.D.   On: 08/15/2021 01:49      Assessment/Plan Principal Problem:   SBO (small bowel obstruction) (HCC) Active Problems:   UTI (urinary tract infection)   Pancytopenia (HCC)   Hyperlipidemia   Prostate cancer metastatic to pelvis ([facility]   Bladder mass   Assessment and Plan: * SBO (small bowel obstruction) (HCC) Consulted Dr. PHampton Abbotfor general surgery.  Patient currently does not have active nausea and vomiting.  Dr. PHampton Abbotrecommended conservative treatment now.  [facility] -Hold off NG tube now since patient does not have  active nausea vomiting. -As needed Zofran, morphine -IV fluid: 1 L normal saline, followed by 100 cc/h -Palliative consult per Dr. PMont Duttonrecommendation  UTI (urinary tract infection) Patient does not meet criteria for sepsis. - IV Rocephin -Follow-up of blood culture and urine culture  Pancytopenia ([place] Patient is getting blood transfusion every 2 weeks.  WBC 0.9, hemoglobin 8.4 which was 6.5 on 08/03/2021, platelet of 133.  No active bleeding. Per his oncologist, Dr. [facility] note on 6/29, "The patient has not started filgrastim injections  at this time. He has an appointment with Dr. Berline Chough in Malignant Hematology on 08/17/2021 for further evaluation".  -Follow-up with CBC -Neutropenia precaution  Hyperlipidemia - Crestor   Prostate cancer metastatic to pelvis ([place]) Pt has new bladder mass.  -Pt has an appointment with Dr. Tresa Endo (Urology) on 08/22/2021.    Bladder mass -see above          DVT ppx: SQ Heparin     Code Status: DNR per pt and her daughter  Family Communication: Yes, patient's daughter by phone  Disposition Plan:  Anticipate discharge back to previous environment  Consults called: Dr. Hampton Abbot of surgery  Admission status and Level of care: Med-Surg:    as inpt     Severity of Illness:  The appropriate patient status for this patient is INPATIENT. Inpatient status is judged to be reasonable and necessary in order to provide the required intensity of service to ensure the patient's safety. The patient's presenting symptoms, physical exam findings, and initial radiographic and laboratory data in the context of their chronic comorbidities is felt to place them at high risk for further clinical deterioration. Furthermore, it is not anticipated that the patient will be medically stable for discharge from the hospital within 2 midnights of admission.   * I certify that at the point of admission it is my clinical judgment that  the patient will require inpatient hospital care spanning beyond 2 midnights from the point of admission due to high intensity of service, high risk for further deterioration and high frequency of surveillance required.*       Date of Service 08/15/2021    Ivor Costa [facility]   If 7PM-7AM, please contact night-coverage [email or web address] 08/15/2021, 3:03 PM

## 2021-08-15 NOTE — Assessment & Plan Note (Signed)
-   Crestor 

## 2021-08-15 NOTE — Assessment & Plan Note (Signed)
Patient does not meet criteria for sepsis. - IV Rocephin -Follow-up of blood culture and urine culture

## 2021-08-15 NOTE — Plan of Care (Signed)
Pt arrived to unit AAOx4, no pain. VS are stable. Protective precautions initiated. Plan for NPO, bowel rest and pain control. Bed is in lowest position, call light within reach. Will continue to monitor.

## 2021-08-16 ENCOUNTER — Inpatient Hospital Stay: Payer: Medicare HMO

## 2021-08-16 DIAGNOSIS — Z7189 Other specified counseling: Secondary | ICD-10-CM

## 2021-08-16 DIAGNOSIS — K56609 Unspecified intestinal obstruction, unspecified as to partial versus complete obstruction: Secondary | ICD-10-CM | POA: Diagnosis not present

## 2021-08-16 LAB — BASIC METABOLIC PANEL
Anion gap: 4 — ABNORMAL LOW (ref 5–15)
BUN: 21 mg/dL (ref 8–23)
CO2: 26 mmol/L (ref 22–32)
Calcium: 7.9 mg/dL — ABNORMAL LOW (ref 8.9–10.3)
Chloride: 108 mmol/L (ref 98–111)
Creatinine, Ser: 1.14 mg/dL (ref 0.61–1.24)
GFR, Estimated: >60 mL/min (ref >60)
Glucose, Bld: 95 mg/dL (ref 70–99)
Potassium: 4.2 mmol/L (ref 3.5–5.1)
Sodium: 138 mmol/L (ref 135–145)

## 2021-08-16 LAB — GLUCOSE, CAPILLARY: Glucose-Capillary: 105 mg/dL — ABNORMAL HIGH (ref 70–99)

## 2021-08-16 LAB — CBC
HCT: 19.9 % — ABNORMAL LOW (ref 39.0–52.0)
Hemoglobin: 6.3 g/dL — ABNORMAL LOW (ref 13.0–17.0)
MCH: 31.3 pg (ref 26.0–34.0)
MCHC: 31.7 g/dL (ref 30.0–36.0)
MCV: 99 fL (ref 80.0–100.0)
Platelets: 82 10*3/uL — ABNORMAL LOW (ref 150–400)
RBC: 2.01 MIL/uL — ABNORMAL LOW (ref 4.22–5.81)
RDW: 16.4 % — ABNORMAL HIGH (ref 11.5–15.5)
WBC: 0.5 10*3/uL — CL (ref 4.0–10.5)
nRBC: 0 % (ref 0.0–0.2)

## 2021-08-16 LAB — URINE CULTURE

## 2021-08-16 LAB — PREPARE RBC (CROSSMATCH)

## 2021-08-16 MED ORDER — TBO-FILGRASTIM 300 MCG/0.5ML ~~LOC~~ SOSY
300.0000 ug | PREFILLED_SYRINGE | Freq: Once | SUBCUTANEOUS | Status: AC
Start: 2021-08-16 — End: 2021-08-16
  Administered 2021-08-16: 300 ug via SUBCUTANEOUS
  Filled 2021-08-16: qty 0.5

## 2021-08-16 MED ORDER — SODIUM CHLORIDE 0.9% IV SOLUTION: Freq: Once | INTRAVENOUS | Status: DC

## 2021-08-16 NOTE — Progress Notes (Signed)
Lab called to ask regarding Hgb 6.3 and Platelets 82 this am; will redraw labs. Barbaraann Faster, RN 5:55 AM 08/16/2021

## 2021-08-16 NOTE — Progress Notes (Signed)
[facility] SURGICAL PROGRESS NOTE (cpt [phone])  Hospital Day(s): 1.   Interval History: Patient seen and examined, no acute events or new complaints overnight. Patient reports he is feeling better. He denies abdominal pain, fever, chills, nausea, emesis. He was worsening chronic leukopenia; 0.5K. Hgb to 6.3. Plt to 82K. Renal function normal with sCr - 1.14. No significant electrolyte derangements. He is NPO. He is passing flatus.   Review of Systems:  Constitutional: denies fever, chills  HEENT: denies cough or congestion  Respiratory: denies any shortness of breath  Cardiovascular: denies chest pain or palpitations  Gastrointestinal: denies abdominal pain, N/V, or diarrhea/and bowel function as per interval history Genitourinary: denies burning with urination or urinary frequency Musculoskeletal: denies pain, decreased motor or sensation  Vital signs in last 24 hours: [min-max] current  Temp:  [98 F (36.7 C)-98.7 F (37.1 C)] 98.6 F (37 C) (07/06 0258) Pulse Rate:  [61-73] 73 (07/06 0258) Resp:  [15-20] 16 (07/06 0258) BP: (101-139)/(49-73) 139/73 (07/06 0258) SpO2:  [89 %-100 %] 96 % (07/06 0258)     Height: 6' (182.9 cm) Weight: 104.3 kg BMI (Calculated): 31.19   Intake/Output last 2 shifts:  07/05 0701 - 07/06 0700 In: 886.1 [I.V.:886.1] Out: -    Physical Exam:  Constitutional: alert, cooperative and no distress  HENT: normocephalic without obvious abnormality  Eyes: PERRL, EOM's grossly intact and symmetric  Respiratory: breathing non-labored at rest  Cardiovascular: regular rate and sinus rhythm  Gastrointestinal: soft, non-tender, and non-distended, no rebound/guarding Musculoskeletal: no edema or wounds, motor and sensation grossly intact, NT    Labs:     Latest Ref Rng & Units 08/16/2021    4:16 AM 08/14/2021    9:41 PM 08/03/2021    2:20 PM  CBC  WBC 4.0 - 10.5 K/uL 0.5  0.9    0.9  0.8   Hemoglobin 13.0 - 17.0 g/dL 6.3  8.3    8.4  6.5    Hematocrit 39.0 - 52.0 % 19.9  26.6    26.4  19.7   Platelets 150 - 400 K/uL 82  131    133  123       Latest Ref Rng & Units 08/16/2021    4:16 AM 08/14/2021    9:41 PM 08/03/2021    2:20 PM  CMP  Glucose 70 - 99 mg/dL 95  140  124   BUN 8 - 23 mg/dL '21  24  20   '$ Creatinine 0.61 - 1.24 mg/dL 1.14  1.10  1.13   Sodium 135 - 145 mmol/L 138  137  136   Potassium 3.5 - 5.1 mmol/L 4.2  4.2  4.2   Chloride 98 - 111 mmol/L 108  105  102   CO2 22 - 32 mmol/L '26  27  27   '$ Calcium 8.9 - 10.3 mg/dL 7.9  9.0  9.1   Total Protein 6.5 - 8.1 g/dL  6.1  5.6   Total Bilirubin 0.3 - 1.2 mg/dL  0.9  0.6   Alkaline Phos 38 - 126 U/L  46  37   AST 15 - 41 U/L  35  23   ALT 0 - 44 U/L  41  34      Imaging studies:  KUB (08/16/2021) personally reviewed showing air in colon with improvement in small bowel dilation, and radiologist report reviewed: IMPRESSION: Likely improved small bowel obstruction.  Assessment/Plan: (ICD-10's: K29.609) 86 y.o. male with clinically improving small bowel obstruction with distal transition  point with mesenteric masses in this area which is concerning for malignant obstruction, complicated by pertinent comorbidities including history of prostate CA, bladder masses, and pancytopenia.   - Start CLD; advance slowly  - No emergent surgical intervention. Again, given that this si likely malignant in nature, surgery will likely not ever be an option.              - Monitor abdominal examination; on-going bowel function            - Can consider serial KUBs            - Pain control prn; antiemetics prn  - Mobilization as tolerated - Further management per primary service; we will follow     All of the above findings and recommendations were discussed with the patient, and the medical team, and all of patient's questions were answered to his expressed satisfaction.  -- Edison Simon, PA-C  [facility] 08/16/2021, 7:22 AM M-F: 7am - 4pm
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
## 2021-08-16 NOTE — Consult Note (Addendum)
Consultation Note Date: 08/16/2021   Patient Name: Nicolas Woodward  DOB: [birth year]  MRN: [id number]  Age / Sex: 86 y.o., male  PCP: Ginger Organ., MD Referring Physician: Terrilee Croak, MD  Reason for Consultation: Establishing goals of care  HPI/Patient Profile: Nicolas Woodward is a 86 y.o. male with medical history significant of metastatic prostate cancer with bladder mass following up with urologsit, pancytopenia requiring packed red cell transfusion every 2 weeks, hyperlipidemia, thoracic aortic aneurysm, who presents with abdominal pain.    Clinical Assessment and Goals of Care: Notes and labs reviewed in detail from [place], [place]. Patient is resting in bed. No family at bedside. Patient states he is married with 1 daughter and grandchildren. He is a retired Dealer.   He discusses his treatments and care up to this point. He states he has been "going down hill since 6 weeks before January 2023". He states  "I'm ready to go, I know I'm dying."   We discussed his diagnoses, prognosis, GOC, EOL wishes disposition and options.  Created space and opportunity for patient  to explore thoughts and feelings regarding current medical information.   A detailed discussion was had today regarding advanced directives.  Concepts specific to code status, artifical feeding and hydration, IV antibiotics and rehospitalization were discussed.  The difference between an aggressive medical intervention path and a comfort care path was discussed.  Values and goals of care important to patient and family were attempted to be elicited.  Discussed limitations of medical interventions to prolong quality of life in some situations and discussed the concept of human mortality.  Patient tells me QOL is important to him. He states he knows he is not a surgery candidate, but was amenable even if it meant "I die on the  table", if it would allow him to eat. He articulates that he is not a candidate for any further oncologic intervention.   He confirms DNR/DNI, no feeding tube, no dialysis. He only wants to treat treatable. He is interested in continuing blood transfusions at this time. He states he does not want hospice care as he could just die instead. Discussed the purpose of hospice, comfort and dignity, and a goal of the best QOL for what time he has left. Discussed the importance of a peaceful end of life process for he and his family.   He is amenable to outpatient palliative with transition to hospice care when he is ready.   Wife entered conversation prior to my departure. Recapped conversation to her. Questions answered.     SUMMARY OF RECOMMENDATIONS    Recommend outpatient palliative with transition to hospice care when patient and family are ready.    Prognosis:  < 6 months       Primary Diagnoses: Present on Admission:  SBO (small bowel obstruction) ([place])  Prostate cancer metastatic to pelvis [facility])  Hyperlipidemia  Bladder mass  Pancytopenia (HCC)  UTI (urinary tract infection)   I have reviewed the medical record, interviewed the patient  and family, and examined the patient. The following aspects are pertinent.  Past Medical History:  Diagnosis Date   Anemia    Atherosclerosis    Back pain    High coronary artery calcium score    Hyperlipidemia    Hypogonadism in male    Lymphadenopathy    OA (osteoarthritis)    Obesity    Osteopenia    Prostate cancer (HCC)    Prostate cancer metastatic to pelvis (HCC)    Rash, skin    LEG   Thoracic aortic aneurysm (TAA) (HCC)    Social History   Socioeconomic History   Marital status: Married    Spouse name: Not on file   Number of children: Not on file   Years of education: Not on file   Highest education level: Not on file  Occupational History   Not on file  Tobacco Use   Smoking status: Never   Smokeless tobacco:  Never  Substance and Sexual Activity   Alcohol use: No    Alcohol/week: 0.0 standard drinks of alcohol   Drug use: Not on file   Sexual activity: Not on file  Other Topics Concern   Not on file  Social History Narrative   Not on file   Social Determinants of Health   Financial Resource Strain: Not on file  Food Insecurity: Not on file  Transportation Needs: Not on file  Physical Activity: Not on file  Stress: Not on file  Social Connections: Not on file   Family History  Problem Relation Age of Onset   Vascular Disease Mother    Cancer Mother    Heart attack Father    Diabetes Father    Healthy Sister    Heart attack Brother    Diabetes Paternal Grandmother    Other Paternal Grandfather        OLD AGE   Healthy Brother    Healthy Daughter    Scheduled Meds: Continuous Infusions:  sodium chloride 100 mL/hr at 08/16/21 1458   cefTRIAXone (ROCEPHIN)  IV 1 g (08/16/21 0336)   PRN Meds:.acetaminophen **OR** acetaminophen, magnesium hydroxide, morphine injection, ondansetron **OR** ondansetron (ZOFRAN) IV, mouth rinse, traZODone Medications Prior to Admission:  Prior to Admission medications   Medication Sig Start Date End Date Taking? Authorizing Provider  Ascorbic Acid (VITAMIN C) 1000 MG tablet Take 1,000 mg by mouth daily.   Yes [provider]  Cholecalciferol 125 MCG (5000 UT) TABS Take 1 tablet by mouth daily.   Yes [provider]  Multiple Vitamin (MULTIVITAMIN) capsule Take 1 capsule by mouth daily.   Yes [provider]  rosuvastatin (CRESTOR) 20 MG tablet Take 20 mg by mouth.   Yes [provider]   Allergies  Allergen Reactions   Simvastatin Other (See Comments)    myalgia   Review of Systems  All other systems reviewed and are negative.   Physical Exam Pulmonary:     Effort: Pulmonary effort is normal.  Neurological:     Mental Status: He is alert.     Vital Signs: BP 123/69 (BP Location: Right Arm)   Pulse  89   Temp 99 F (37.2 C) (Oral)   Resp 18   Ht 6' (1.829 m)   Wt 104.3 kg   SpO2 94%   BMI 31.19 kg/m  Pain Scale: 0-10 POSS *See Group Information*: 1-Acceptable,Awake and alert Pain Score: 0-No pain   SpO2: SpO2: 94 % O2 Device:SpO2: 94 % O2 Flow Rate: .O2 Flow Rate (  L/min): 2 L/min  IO: Intake/output summary:  Intake/Output Summary (Last 24 hours) at 08/16/2021 1505 Last data filed at 08/16/2021 1259 Gross per 24 hour  Intake 3176.26 ml  Output --  Net 3176.26 ml    LBM: Last BM Date : 08/14/21 Baseline Weight: Weight: 104.3 kg Most recent weight: Weight: 104.3 kg      Signed by: Asencion Gowda, NP   Please contact Palliative Medicine Team phone at [phone] for questions and concerns.  For individual provider: See Shea Evans

## 2021-08-16 NOTE — Progress Notes (Signed)
Patient asked if he has noticed any bleeding and he has denied this;  None noted in bed, on sheets, patient's clothing or IV site. Will continue to monitor. Barbaraann Faster, RN 6:23 AM 08/16/2021

## 2021-08-16 NOTE — Care Management Important Message (Signed)
Important Message  Patient Details  Name: Nicolas Woodward MRN: [id number] Date of Birth: Jan 14, 1936   Medicare Important Message Given:  N/A - LOS <3 / Initial given by admissions     [name] 08/16/2021, 4:51 PM

## 2021-08-17 DIAGNOSIS — K56609 Unspecified intestinal obstruction, unspecified as to partial versus complete obstruction: Secondary | ICD-10-CM | POA: Diagnosis not present

## 2021-08-17 DIAGNOSIS — Z7189 Other specified counseling: Secondary | ICD-10-CM | POA: Diagnosis not present

## 2021-08-17 LAB — TYPE AND SCREEN
ABO/RH(D): O POS
Antibody Screen: NEGATIVE
Unit division: 0

## 2021-08-17 LAB — BASIC METABOLIC PANEL
Anion gap: 3 — ABNORMAL LOW (ref 5–15)
BUN: 19 mg/dL (ref 8–23)
CO2: 24 mmol/L (ref 22–32)
Calcium: 7.4 mg/dL — ABNORMAL LOW (ref 8.9–10.3)
Chloride: 113 mmol/L — ABNORMAL HIGH (ref 98–111)
Creatinine, Ser: 1.02 mg/dL (ref 0.61–1.24)
GFR, Estimated: >60 mL/min (ref >60)
Glucose, Bld: 96 mg/dL (ref 70–99)
Potassium: 4 mmol/L (ref 3.5–5.1)
Sodium: 140 mmol/L (ref 135–145)

## 2021-08-17 LAB — BPAM RBC
Blood Product Expiration Date: 202308052359
ISSUE DATE / TIME: 202307061719
Unit Type and Rh: 5100

## 2021-08-17 MED ORDER — POLYETHYLENE GLYCOL 3350 17 G PO PACK
17.0000 g | PACK | Freq: Every day | ORAL | Status: DC
  Administered 2021-08-17 – 2021-08-18 (×2): 17 g via ORAL
  Filled 2021-08-17 (×2): qty 1

## 2021-08-17 NOTE — Hospital Course (Signed)
Past medical history of prostate cancer with pelvic metastasis, pancytopenia requiring blood transfusion every 2 weeks, HLD, TAA, chronic edema presents to the hospital with complaints of abdominal pain and nausea.  Found to have small bowel obstruction.  General surgery was consulted.  Conservative management recommended.  Palliative care also consulted.

## 2021-08-17 NOTE — Progress Notes (Signed)
[facility] SURGICAL PROGRESS NOTE (cpt [phone])  Hospital Day(s): 2.   Interval History: Patient seen and examined, no acute events or new complaints overnight. Patient reports he had an isolated and transient episode of discomfort overnight but has otherwise been pain free. No fever, chills, nausea, emesis. He remains leukopenic at 0.9K. Hgb to 7.4. Plt to 69K. Renal function normal with sCr - 1.02. No significant electrolyte derangements. He has been on CLD; tolerating. Passing flatus. No stool yet.    Review of Systems:  Constitutional: denies fever, chills  HEENT: denies cough or congestion  Respiratory: denies any shortness of breath  Cardiovascular: denies chest pain or palpitations  Gastrointestinal: denies abdominal pain, N/V, or diarrhea/and bowel function as per interval history Genitourinary: denies burning with urination or urinary frequency Musculoskeletal: denies pain, decreased motor or sensation  Vital signs in last 24 hours: [min-max] current  Temp:  [98.5 F (36.9 C)-99.2 F (37.3 C)] 98.5 F (36.9 C) (07/07 0334) Pulse Rate:  [73-89] 75 (07/07 0334) Resp:  [14-18] 18 (07/07 0334) BP: (120-137)/(61-74) 133/62 (07/07 0334) SpO2:  [93 %-95 %] 93 % (07/07 0334)     Height: 6' (182.9 cm) Weight: 104.3 kg BMI (Calculated): 31.19   Intake/Output last 2 shifts:  07/06 0701 - 07/07 0700 In: 2694.2 [P.O.:240; I.V.:2046.8; Blood:404; IV Piggyback:3.4] Out: -    Physical Exam:  Constitutional: alert, cooperative and no distress  HENT: normocephalic without obvious abnormality  Eyes: PERRL, EOM's grossly intact and symmetric  Respiratory: breathing non-labored at rest  Cardiovascular: regular rate and sinus rhythm  Gastrointestinal: soft, non-tender, and non-distended, no rebound/guarding Musculoskeletal: no edema or wounds, motor and sensation grossly intact, NT    Labs:     Latest Ref Rng & Units 08/17/2021    3:27 AM 08/16/2021    4:16 AM 08/14/2021     9:41 PM  CBC  WBC 4.0 - 10.5 K/uL 0.9  0.5  0.9    0.9   Hemoglobin 13.0 - 17.0 g/dL 7.4  6.3  8.3    8.4   Hematocrit 39.0 - 52.0 % 23.4  19.9  26.6    26.4   Platelets 150 - 400 K/uL 69  82  131    133       Latest Ref Rng & Units 08/17/2021    3:27 AM 08/16/2021    4:16 AM 08/14/2021    9:41 PM  CMP  Glucose 70 - 99 mg/dL 96  95  140   BUN 8 - 23 mg/dL '19  21  24   '$ Creatinine 0.61 - 1.24 mg/dL 1.02  1.14  1.10   Sodium 135 - 145 mmol/L 140  138  137   Potassium 3.5 - 5.1 mmol/L 4.0  4.2  4.2   Chloride 98 - 111 mmol/L 113  108  105   CO2 22 - 32 mmol/L '24  26  27   '$ Calcium 8.9 - 10.3 mg/dL 7.4  7.9  9.0   Total Protein 6.5 - 8.1 g/dL   6.1   Total Bilirubin 0.3 - 1.2 mg/dL   0.9   Alkaline Phos 38 - 126 U/L   46   AST 15 - 41 U/L   35   ALT 0 - 44 U/L   41     Imaging studies: No new pertinent imaging studies    Assessment/Plan: (ICD-10's: K32.609) 86 y.o. male with clinically improving small bowel obstruction with distal transition point with mesenteric masses in this area  which is concerning for malignant obstruction, complicated by pertinent comorbidities including history of prostate CA, bladder masses, and pancytopenia.   - Trial on FLD; continue to advance slowly and monitor for bowel function   - No emergent surgical intervention. Again, given that this is likely malignant in nature, surgery will likely not ever be an option.              - Monitor abdominal examination; on-going bowel function            - Can consider serial KUBs            - Pain control prn; antiemetics prn  - Mobilization as tolerated - Further management per primary service; we will follow     All of the above findings and recommendations were discussed with the patient, and the medical team, and all of patient's questions were answered to his expressed satisfaction.  -- Edison Simon, PA-C [facility] 08/17/2021, 7:23 AM M-F: 7am - 4pm

## 2021-08-17 NOTE — TOC Progression Note (Signed)
Transition of Care [facility]) - Progression Note    Patient Details  Name: Nicolas Woodward MRN: [id number] Date of Birth: September 11, 1935  [facility] CM/SW Contact  Laurena Slimmer, RN Phone Number: 08/17/2021, 2:57 PM  Clinical Narrative:    Patient will needs palliative care from home. Daphene Calamity contacted.        Expected Discharge Plan and Services                                                 Social Determinants of Health (SDOH) Interventions    Readmission Risk Interventions     No data to display

## 2021-08-17 NOTE — Progress Notes (Signed)
Daily Progress Note   Patient Name: Nicolas Woodward       Date: 08/17/2021 DOB: 1935-07-11  Age: 86 y.o. MRN#: [id number] Attending Physician: Lavina Hamman, MD Primary Care Physician: Ginger Organ., MD Admit Date: 08/15/2021  Reason for Consultation/Follow-up: Establishing goals of care  Subjective: Notes and labs reviewed personally.  In to bedside.  Patient is resting in bed no family at bedside.  He states he has been able to tolerate a clear liquid diet, and he has now been progressed to a full liquid diet.  He denies pain or nausea.    He states he has considered our conversation yesterday and is happy for outpatient palliative to follow. He states when he develops pain or other symptoms he will be amenable to changing to hospice care. He states at this time he wants to continue his biweekly blood transfusions as they give him energy.  He is clear he understands he is going to die, and has no interest in returning to the hospital unless he has symptoms such as pain or shortness of breath that cannot be managed at home.  Length of Stay: 2  Current Medications: Scheduled Meds:   sodium chloride   Intravenous Once   polyethylene glycol  17 g Oral Daily    Continuous Infusions:  sodium chloride 100 mL/hr at 08/17/21 0823   cefTRIAXone (ROCEPHIN)  IV Stopped (08/17/21 0230)    PRN Meds: acetaminophen **OR** acetaminophen, morphine injection, ondansetron **OR** ondansetron (ZOFRAN) IV, mouth rinse, traZODone  Physical Exam Pulmonary:     Effort: Pulmonary effort is normal.  Neurological:     Mental Status: He is alert.             Vital Signs: BP 130/61   Pulse 80   Temp 98.5 F (36.9 C)   Resp 16   Ht 6' (1.829 m)   Wt 104.3 kg   SpO2 95%   BMI 31.19 kg/m  SpO2:  SpO2: 95 % O2 Device: O2 Device: Room Air O2 Flow Rate: O2 Flow Rate (L/min): 2 L/min  Intake/output summary:  Intake/Output Summary (Last 24 hours) at 08/17/2021 1244 Last data filed at 08/17/2021 0823 Gross per 24 hour  Intake 4096.31 ml  Output --  Net 4096.31 ml   LBM: Last BM Date : 08/17/21 Baseline  Weight: Weight: 104.3 kg Most recent weight: Weight: 104.3 kg  Patient Active Problem List   Diagnosis Date Noted   SBO (small bowel obstruction) ([place]) 08/15/2021   Prostate cancer metastatic to pelvis ([place]) 08/15/2021   Hyperlipidemia 08/15/2021   Bladder mass 08/15/2021   Pancytopenia ([place]) 08/15/2021   UTI (urinary tract infection) 08/15/2021   Thrombocytopenia ([place]) 11/15/2015   Pulmonary embolism ([place]) 11/14/2015   Pleuritic chest pain 11/14/2015   RUQ abdominal pain 11/14/2015   History of prostate cancer 11/14/2015   Plantar fasciitis of right foot 07/05/2015   Ingrown nail 05/16/2015   Paronychia of great toe, left 05/16/2015    Palliative Care Assessment & Plan   Recommendations/Plan: Patient will need outpatient palliative to follow closely with transition to hospice level of care when patient is ready.    Code Status:    Code Status Orders  (From admission, onward)           Start     Ordered   08/15/21 1504  Do not attempt resuscitation (DNR)  Continuous        08/15/21 1503           Code Status History     Date Active Date Inactive Code Status Order ID Comments User Context   08/15/2021 0558 08/15/2021 1503 Full Code [id number]  Christel Mormon, MD ED   11/14/2015 1621 11/15/2015 2010 Full Code [id number]  Murlean Iba, MD Inpatient      Advance Directive Documentation    Flowsheet Row Most Recent Value  Type of Advance Directive Healthcare Power of Attorney, Living will  Pre-existing out of facility DNR order (yellow form or pink MOST form) --  "MOST" Form in Place? --       Prognosis:  < 6 months    Thank you for allowing the  Palliative Medicine Team to assist in the care of this patient.   Asencion Gowda, NP  Please contact Palliative Medicine Team phone at [phone] for questions and concerns.

## 2021-08-17 NOTE — Progress Notes (Signed)
[facility] Liaison Note  Notified by PMT/Crystal of patient/family request of [facility] Paliative services.  [facility] hospital liaison will follow patient for discharge disposition.   Please call with any questions/concerns.    Thank you for the opportunity to participate in this patient's care.   Daphene Calamity, MSW [facility] Liaison  [phone]

## 2021-08-17 NOTE — Progress Notes (Signed)
  Progress Note Patient: Nicolas Woodward MOQ:[id number] DOB: [birth year] DOA: 08/15/2021  DOS: the patient was seen and examined on 08/17/2021  Brief hospital course: Past medical history of prostate cancer with pelvic metastasis, pancytopenia requiring blood transfusion every 2 weeks, HLD, TAA, chronic edema presents to the hospital with complaints of abdominal pain and nausea.  Found to have small bowel obstruction.  General surgery was consulted.  Conservative management recommended.  Palliative care also consulted. Assessment and Plan: Small bowel obstruction. Presented with abdominal pain and nausea. CT abdomen shows evidence of distal small bowel obstruction. General surgery was consulted.  Transition point is next to mesenteric mass concerning for malignant obstruction. Conservative management recommended. Patient was started on full liquid diet on 7/7. Currently tolerating it well. Monitor.  Chronic pancytopenia secondary to malignancy. Acute on chronic anemia secondary to nutritional deficiency. Reports rarely receives blood transfusion every 2 weeks as outpatient. Currently also has consult low. Patient actually received 1 PRBC transfusion as well as filgrastim injection in the hospital. Currently counts are stable.  Platelet counts are somewhat low.  Monitor.  UTI. Patient is currently on IV antibiotic. Will monitor.  Chronic edema. Right leg is chronically swollen. DVT negative. Monitor.  HLD. Not on any statin right now.  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 New bladder mass. Possible malignant obstruction. Followed up with urologist Dr. Delora Fuel at [facility]. CT scan shows evidence of right-sided hydronephrosis with hydroureter. Prognosis is very guarded. Palliative care was consulted.  Goals of care conversation. Per palliative care patient's goal is to continue transfusion on discharge for now but establish care with palliative care and consider hospice on  discharge. Patient is currently DNR. We will monitor.  Obesity. Body mass index is 31.19 kg/m.  Placing the patient at high risk of poor outcome.  Subjective: No nausea no vomiting.  Passing gas.  Tolerating full liquid.  Physical Exam: Vitals:   08/16/21 1740 08/16/21 2047 08/17/21 0334 08/17/21 0821  BP: 120/61 137/74 133/62 130/61  Pulse: 74 73 75 80  Resp: '14 18 18 16  '$ Temp: 98.9 F (37.2 C) 98.8 F (37.1 C) 98.5 F (36.9 C) 98.5 F (36.9 C)  TempSrc: Oral Oral Oral   SpO2: 93% 95% 93% 95%  Weight:      Height:       General: Appear in mild distress; no visible Abnormal Neck Mass Or lumps, Conjunctiva normal Cardiovascular: S1 and S2 Present, NO Murmur, Respiratory: good respiratory effort, Bilateral Air entry present and CTA, no Crackles, no wheezes Abdomen: Bowel Sound present, Non tender  Extremities: no Pedal edema Neurology: alert and oriented to time, place, and person  Gait not checked due to patient safety concerns   Data Reviewed: I have Reviewed nursing notes, Vitals, and Lab results since pt's last encounter. Pertinent lab results CBC and BMP I have ordered test including CBC and BMP I have reviewed the last note from general surgery,   Discussed patient's care plan with palliative care.  Family Communication: None at bedside  Disposition: Status is: Inpatient Remains inpatient appropriate because: Gradual advancement for diet to ensure tolerance.  Author: Berle Mull, MD 08/17/2021 6:53 PM  Please look on [email or web address] to find out who is on call.

## 2021-08-18 ENCOUNTER — Inpatient Hospital Stay: Payer: Medicare HMO

## 2021-08-18 DIAGNOSIS — K56609 Unspecified intestinal obstruction, unspecified as to partial versus complete obstruction: Secondary | ICD-10-CM | POA: Diagnosis not present

## 2021-08-18 DIAGNOSIS — Z515 Encounter for palliative care: Secondary | ICD-10-CM

## 2021-08-18 LAB — CBC WITH DIFFERENTIAL/PLATELET
Abs Immature Granulocytes: 0.02 10*3/uL (ref 0.00–0.07)
Abs Immature Granulocytes: 0.02 10*3/uL (ref 0.00–0.07)
Basophils Absolute: 0 10*3/uL (ref 0.0–0.1)
Basophils Absolute: 0 10*3/uL (ref 0.0–0.1)
Basophils Relative: 0 %
Basophils Relative: 0 %
Eosinophils Absolute: 0 10*3/uL (ref 0.0–0.5)
Eosinophils Absolute: 0 10*3/uL (ref 0.0–0.5)
Eosinophils Relative: 3 %
Eosinophils Relative: 1 %
HCT: 23.4 % — ABNORMAL LOW (ref 39.0–52.0)
HCT: 22 % — ABNORMAL LOW (ref 39.0–52.0)
Hemoglobin: 7.4 g/dL — ABNORMAL LOW (ref 13.0–17.0)
Hemoglobin: 7.2 g/dL — ABNORMAL LOW (ref 13.0–17.0)
Immature Granulocytes: 2 %
Immature Granulocytes: 2 %
Lymphocytes Relative: 24 %
Lymphocytes Relative: 15 %
Lymphs Abs: 0.2 10*3/uL — ABNORMAL LOW (ref 0.7–4.0)
Lymphs Abs: 0.1 10*3/uL — ABNORMAL LOW (ref 0.7–4.0)
MCH: 31 pg (ref 26.0–34.0)
MCH: 31.3 pg (ref 26.0–34.0)
MCHC: 31.6 g/dL (ref 30.0–36.0)
MCHC: 32.7 g/dL (ref 30.0–36.0)
MCV: 97.9 fL (ref 80.0–100.0)
MCV: 95.7 fL (ref 80.0–100.0)
Monocytes Absolute: 0.2 10*3/uL (ref 0.1–1.0)
Monocytes Absolute: 0.2 10*3/uL (ref 0.1–1.0)
Monocytes Relative: 26 %
Monocytes Relative: 22 %
Neutro Abs: 0.4 10*3/uL — CL (ref 1.7–7.7)
Neutro Abs: 0.6 10*3/uL — ABNORMAL LOW (ref 1.7–7.7)
Neutrophils Relative %: 45 %
Neutrophils Relative %: 60 %
Platelets: 69 10*3/uL — ABNORMAL LOW (ref 150–400)
Platelets: 67 10*3/uL — ABNORMAL LOW (ref 150–400)
RBC: 2.39 MIL/uL — ABNORMAL LOW (ref 4.22–5.81)
RBC: 2.3 MIL/uL — ABNORMAL LOW (ref 4.22–5.81)
RDW: 15.8 % — ABNORMAL HIGH (ref 11.5–15.5)
RDW: 15.9 % — ABNORMAL HIGH (ref 11.5–15.5)
Smear Review: DECREASED
WBC: 0.9 10*3/uL — CL (ref 4.0–10.5)
WBC: 1 10*3/uL — CL (ref 4.0–10.5)
nRBC: 0 % (ref 0.0–0.2)
nRBC: 0 % (ref 0.0–0.2)

## 2021-08-18 LAB — BASIC METABOLIC PANEL
Anion gap: 4 — ABNORMAL LOW (ref 5–15)
BUN: 16 mg/dL (ref 8–23)
CO2: 22 mmol/L (ref 22–32)
Calcium: 7.5 mg/dL — ABNORMAL LOW (ref 8.9–10.3)
Chloride: 111 mmol/L (ref 98–111)
Creatinine, Ser: 1 mg/dL (ref 0.61–1.24)
GFR, Estimated: >60 mL/min (ref >60)
Glucose, Bld: 130 mg/dL — ABNORMAL HIGH (ref 70–99)
Potassium: 3.7 mmol/L (ref 3.5–5.1)
Sodium: 137 mmol/L (ref 135–145)

## 2021-08-18 MED ORDER — METOCLOPRAMIDE HCL 10 MG PO TABS: 10.0000 mg | ORAL_TABLET | Freq: Four times a day (QID) | ORAL | 1 refills | Status: AC | PRN

## 2021-08-18 MED ORDER — SODIUM CHLORIDE 0.9 % IV SOLN
2.0000 g | Freq: Three times a day (TID) | INTRAVENOUS | Status: DC
  Administered 2021-08-18: 2 g via INTRAVENOUS
  Filled 2021-08-18: qty 2
  Filled 2021-08-18: qty 12.5

## 2021-08-18 MED ORDER — TRAZODONE HCL 50 MG PO TABS: 50.0000 mg | ORAL_TABLET | Freq: Every evening | ORAL | 0 refills | Status: AC | PRN

## 2021-08-18 MED ORDER — PROMETHAZINE HCL 25 MG RE SUPP
25.0000 mg | Freq: Four times a day (QID) | RECTAL | 0 refills | Status: AC | PRN
Start: 2021-08-18 — End: ?

## 2021-08-18 MED ORDER — MORPHINE SULFATE (CONCENTRATE) 10 MG /0.5 ML PO SOLN
5.0000 mg | ORAL | 0 refills | Status: AC | PRN
Start: 2021-08-18 — End: ?

## 2021-08-18 MED ORDER — DOCUSATE SODIUM 100 MG PO CAPS: 100.0000 mg | ORAL_CAPSULE | Freq: Two times a day (BID) | ORAL | 0 refills | Status: AC | PRN

## 2021-08-18 MED ORDER — POLYETHYLENE GLYCOL 3350 17 G PO PACK: 17.0000 g | PACK | Freq: Every day | ORAL | 0 refills | Status: AC

## 2021-08-18 MED ORDER — LORAZEPAM 0.5 MG PO TABS: 0.5000 mg | ORAL_TABLET | Freq: Three times a day (TID) | ORAL | 0 refills | Status: AC | PRN

## 2021-08-18 MED ORDER — LEVOFLOXACIN 750 MG PO TABS: 750.0000 mg | ORAL_TABLET | Freq: Every day | ORAL | 0 refills | Status: AC

## 2021-08-18 NOTE — Progress Notes (Signed)
[facility] [employer] Liaison Note  Received request from Dr. Posey Pronto, for hospice services at home after discharge. Spoke with Nicolas Woodward to initiate education related to hospice philosophy, services, and team approach to care. Nicolas Woodward verbalized understanding of information given. Per discussion, the plan is for discharge home by private vehicle on 08/18/2021. DME needs discussed. No DME needs at this time.   Please send signed and completed DNR home with patient. Please provide prescriptions at discharge as needed to ensure ongoing symptom management.   Thank you,  Bea Laura MSN, RN [facility] Liaison [phone]

## 2021-08-18 NOTE — Discharge Summary (Signed)
Physician Discharge Summary   Patient: Nicolas Woodward MRN: [id number] DOB: May 29, 1935  Admit date:     08/15/2021  Discharge date: 08/18/2021  Discharge Physician: Berle Mull  PCP: Ginger Organ., MD  Recommendations at discharge: Establish care with hospice on discharge.  Discharge Diagnoses: Principal Problem:   SBO (small bowel obstruction) (HCC) Active Problems:   UTI (urinary tract infection)   Pancytopenia (HCC)   Hyperlipidemia   Prostate cancer metastatic to pelvis [facility])   Bladder mass   Hospice care patient  Hospital Course: Past medical history of prostate cancer with pelvic metastasis, pancytopenia requiring blood transfusion every 2 weeks, HLD, TAA, chronic edema presents to the hospital with complaints of abdominal pain and nausea.  Found to have small bowel obstruction.  General surgery was consulted.  Conservative management recommended.  Palliative care also consulted.  Assessment and Plan: Small bowel obstruction. Presented with abdominal pain and nausea. CT abdomen shows evidence of distal small bowel obstruction. General surgery was consulted.  Transition point is next to mesenteric mass concerning for malignant obstruction. Conservative management recommended. Patient was tolerating full liquid diet. No abdominal pain no nausea no vomiting. Due to fever or chest x-ray and x-ray abdomen was performed on 7/8 which showed possibility of free air on the right side although clinically patient does not have any evidence of pneumoperitoneum.  Goals of care conversation. Initial plan was to transition to home with palliative care. But at present patient wants to transition to home with hospice care. Personally discussed with [facility] team. We will prescribe medications like morphine, Ativan on discharge. Also Reglan on discharge. Zofran appears to require prior authorization for medication.  Chronic pancytopenia secondary to malignancy. Acute on chronic  anemia secondary to nutritional deficiency. Reports rarely receives blood transfusion every 2 weeks as outpatient. Currently also has consult low. Patient actually received 1 PRBC transfusion as well as filgrastim injection in the hospital. Currently counts are stable.  Platelet counts are somewhat low.  Monitor.  UTI. Empirically was on antibiotics.  Now Will monitor.  Chronic edema. Right leg is chronically swollen. DVT negative. Monitor.  HLD. Not on any statin right now.  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 New bladder mass. Possible malignant obstruction. Followed up with urologist Dr. Delora Fuel at [facility]. CT scan shows evidence of right-sided hydronephrosis with hydroureter. Prognosis is very guarded. Palliative care was consulted.  Patient remains at high risk for recurrent obstruction.  Obesity. Body mass index is 31.19 kg/m.  Placing the patient at high risk of poor outcome.  Hospice patient on discharge.   PDMP was still reviewed.  Consultants: General surgery Palliative care Procedures performed:  NG insertion. DISCHARGE MEDICATION: Allergies as of 08/18/2021       Reactions   Simvastatin Other (See Comments)   myalgia        Medication List     STOP taking these medications    Cholecalciferol 125 MCG (5000 UT) Tabs   multivitamin capsule   rosuvastatin 20 MG tablet Commonly known as: CRESTOR   vitamin C 1000 MG tablet       TAKE these medications    docusate sodium 100 MG capsule Commonly known as: Colace Take 1 capsule (100 mg total) by mouth 2 (two) times daily as needed.   levofloxacin 750 MG tablet Commonly known as: Levaquin Take 1 tablet (750 mg total) by mouth daily for 5 days.   LORazepam 0.5 MG tablet Commonly known as: Ativan Take 1 tablet (0.5 mg  total) by mouth every 8 (eight) hours as needed for anxiety (also for refractory nausea not responding to reglan and phenergan).   metoCLOPramide 10 MG  tablet Commonly known as: REGLAN Take 1 tablet (10 mg total) by mouth every 6 (six) hours as needed for nausea or vomiting.   morphine CONCENTRATE 10 mg / 0.5 ml concentrated solution Take 0.25 mLs (5 mg total) by mouth every 2 (two) hours as needed for severe pain, moderate pain or shortness of breath.   polyethylene glycol 17 g packet Commonly known as: MIRALAX / GLYCOLAX Take 17 g by mouth daily. Start taking on: August 19, 2021   promethazine 25 MG suppository Commonly known as: PHENERGAN Place 1 suppository (25 mg total) rectally every 6 (six) hours as needed for refractory nausea / vomiting (not responding to reglan).   traZODone 50 MG tablet Commonly known as: DESYREL Take 1 tablet (50 mg total) by mouth at bedtime as needed for sleep.       Disposition: Home with hospice Diet recommendation: Regular diet  Discharge Exam: Vitals:   08/17/21 0821 08/17/21 2021 08/18/21 0323 08/18/21 0751  BP: 130/61 (!) 132/59 137/70 135/64  Pulse: 80 78 87 90  Resp: '16 17 17 18  '$ Temp: 98.5 F (36.9 C) 99.3 F (37.4 C) (!) 100.9 F (38.3 C) 99 F (37.2 C)  TempSrc:      SpO2: 95% 95% 100% 93%  Weight:      Height:       General: Appear in no distress; no visible Abnormal Neck Mass Or lumps, Conjunctiva normal Cardiovascular: S1 and S2 Present, no Murmur, Respiratory: good respiratory effort, Bilateral Air entry present and CTA, no Crackles, no wheezes Abdomen: Bowel Sound present, Non tender  Extremities: no Pedal edema Neurology: alert and oriented to time, place, and person  Gait not checked due to patient safety concerns Filed Weights   08/14/21 2140  Weight: 104.3 kg   Condition at discharge: stable  The results of significant diagnostics from this hospitalization (including imaging, microbiology, ancillary and laboratory) are listed below for reference.   Imaging Studies: DG ABD ACUTE 2+V W 1V CHEST  Result Date: 08/18/2021 CLINICAL DATA:  Fever.  Small-bowel  obstruction. EXAM: DG ABDOMEN ACUTE WITH 1 VIEW CHEST COMPARISON:  08/16/2021 FINDINGS: Low lung volumes. The cardio pericardial silhouette is enlarged. Interstitial markings are diffusely coarsened with chronic features. Basilar atelectasis bilaterally, left greater than right with possible tiny left effusion. Lucency under the right hemidiaphragm raises the question of intraperitoneal free air. Diffuse gaseous small bowel distension suggests obstruction and is slightly progressive since 08/15/2021 exam. Numerous surgical clips overlie the pelvis. IMPRESSION: 1. Low lung volumes with bibasilar atelectasis. 2. Lucency under the right hemidiaphragm. Intraperitoneal free air not excluded. CT scan of the abdomen and pelvis could be used to further evaluate as clinically warranted. 3. Interval increase in gaseous small bowel dilatation since 08/16/2021. Evolving small bowel obstruction a concern. This could also be further assessed at CT. These results will be called to the ordering clinician or representative by the Radiologist Assistant, and communication documented in the PACS or [employer]. Electronically Signed   By: Misty Stanley M.D.   On: 08/18/2021 12:23   DG Abd 2 Views  Result Date: 08/16/2021 CLINICAL DATA:  Small bowel obstruction EXAM: ABDOMEN - 2 VIEW COMPARISON:  Yesterday FINDINGS: Less pronounced small bowel dilatation with increased colonic gas compared to scanogram on CT yesterday. No new or progressive abnormality. Pelvic lymphadenectomy clips. IMPRESSION: Likely  improved small bowel obstruction. Electronically Signed   By: Jorje Guild M.D.   On: 08/16/2021 07:36   DG Abd 2 Views  Result Date: 08/15/2021 CLINICAL DATA:  Small-bowel obstruction EXAM: ABDOMEN - 2 VIEW COMPARISON:  None Available. FINDINGS: There are a few air-filled mildly dilated loops of small bowel. Likely similar in comparison to CT topogram. No free air. Residual contrast within dilated left renal collecting  system and ureter. IMPRESSION: Persistent small-bowel obstruction with likely similar distension to CT. Residual contrast within dilated left renal collecting system and ureter. Electronically Signed   By: Macy Mis M.D.   On: 08/15/2021 11:27   CT ABDOMEN PELVIS W CONTRAST  Result Date: 08/15/2021 CLINICAL DATA:  Left lower quadrant abdominal pain. EXAM: CT ABDOMEN AND PELVIS WITH CONTRAST TECHNIQUE: Multidetector CT imaging of the abdomen and pelvis was performed using the standard protocol following bolus administration of intravenous contrast. RADIATION DOSE REDUCTION: This exam was performed according to the departmental dose-optimization program which includes automated exposure control, adjustment of the mA and/or kV according to patient size and/or use of iterative reconstruction technique. CONTRAST:  163m OMNIPAQUE IOHEXOL 300 MG/ML  SOLN COMPARISON:  August 03, 2021 FINDINGS: Lower chest: No acute abnormality. Hepatobiliary: Small, stable hepatic cysts are seen. No gallstones, gallbladder wall thickening, or biliary dilatation. Pancreas: Unremarkable. No pancreatic ductal dilatation or surrounding inflammatory changes. Spleen: Multiple small, stable heterogeneous low-attenuation splenic lesions are noted. Adrenals/Urinary Tract: The right adrenal gland is normal in appearance. Nodularity of the left adrenal gland is seen. The kidneys are normal in size, bilaterally. Marked severity diffuse renal cortical thinning is seen on the right. Small, stable simple cysts are seen within the left kidney. There is stable marked severity right-sided hydronephrosis and hydroureter with dilatation of the intrarenal calices. No renal calculi are identified. A stable approximately 9.5 cm x 5.7 cm heterogeneous lobulated soft tissue mass is seen within the posterior and lateral aspects of the urinary bladder. Stomach/Bowel: Stomach is within normal limits. The appendix is not clearly identified. Numerous dilated  small bowel loops are seen within the abdomen and pelvis (maximum small bowel diameter of approximately 4.0 cm). An abrupt transition zone is seen within the anterior aspect of the lower abdomen, along the midline (axial CT images 54 through 58, CT series 2). Noninflamed diverticula are seen throughout the sigmoid colon. Vascular/Lymphatic: Aortic atherosclerosis. Stable pelvic and right inguinal lymphadenopathy is seen. Reproductive: The prostate gland is surgically absent. Other: No abdominal wall hernia or abnormality. No abdominopelvic ascites. Musculoskeletal: There is grade 2 anterolisthesis of the L4 vertebral body on L5. Multilevel degenerative changes are seen throughout the remainder of the lumbar spine. IMPRESSION: 1. Distal small bowel obstruction with a transition zone seen within the anterior aspect of the lower abdomen, along the midline. 2. Stable marked severity right-sided hydronephrosis and hydroureter with dilatation of the intrarenal calices. 3. Stable lobulated urinary bladder mass consistent with an underlying neoplasm. Further evaluation with soft tissue sampling is recommended. 4. Sigmoid diverticulosis. 5. Stable pelvic and right inguinal lymphadenopathy. 6. Grade 2 anterolisthesis of the L4 vertebral body on L5. 7. Low attenuation splenic lesions which may represent splenic hemangiomas. An underlying neoplastic process cannot be excluded. Further evaluation with nuclear medicine PET/CT is recommended. 8. Aortic atherosclerosis. Aortic Atherosclerosis (ICD10-I70.0). Electronically Signed   By: TVirgina NorfolkM.D.   On: 08/15/2021 01:49   CT ABDOMEN PELVIS W CONTRAST  Result Date: 08/03/2021 CLINICAL DATA:  Prostate cancer, intermediate risk, staging EXAM: CT ABDOMEN AND  PELVIS WITH CONTRAST TECHNIQUE: Multidetector CT imaging of the abdomen and pelvis was performed using the standard protocol following bolus administration of intravenous contrast. RADIATION DOSE REDUCTION: This exam  was performed according to the departmental dose-optimization program which includes automated exposure control, adjustment of the mA and/or kV according to patient size and/or use of iterative reconstruction technique. CONTRAST:  170m OMNIPAQUE IOHEXOL 300 MG/ML  SOLN COMPARISON:  November 14, 2015 FINDINGS: Lower chest: No acute abnormality. Small hiatal hernia. Calcifications of the aortic valve. Hepatobiliary: There are couple of cysts in the liver with the largest cyst in the lateral segment of the left lobe measuring 1.8 cm in comparison to 0.9 cm on the previous study. No discrete hepatic metastasis. Gallbladder is contracted. No intra or extrahepatic biliary dilatation. Pancreas: Unremarkable. No pancreatic ductal dilatation or surrounding inflammatory changes. Spleen: Normal in size without focal abnormality. Adrenals/Urinary Tract: Small 1.4 cm nodule in the left adrenal, stable likely an adenoma. There are couple of cysts seen in the lower pole of the left kidney. Left kidney has a normal appearance. There is severe hydronephrosis-hydroureter and thinning of the renal cortex seen at the right side and is new. Hydroureter extends to the vesicoureteral junction. There is a large lobulated mass in the urinary bladder along the base of the bladder extending into the right lateral wall. The heterogeneous lobulated mass measures approximately 9.4 x 5.7 cm (images 78 through 91 of series 2). There is soft tissue mass seen at the left inferior pelvic wall measuring approximately 5 x 1.7 cm (image 80/2) subjacent to the bladder mass. Stomach/Bowel: Stomach is within normal limits. Appendix appears normal. No evidence of bowel wall thickening, distention, or inflammatory changes. Moderately severe diverticulosis of the sigmoid colon without diverticulitis. Vascular/Lymphatic: Moderate atheromatous calcifications of the infrarenal abdominal aorta extending into the iliac arteries. There is pelvic and inguinal lymph  lymphadenopathy seen and is new. The largest right inguinal lymph node measures 2.7 x 2 cm (image 90/2). Reproductive: Status post radical prostatectomy. Other: 2.1 x 2.4 cm soft tissue mass in the mid pelvis (image 70/2). 1.6 cm nodular density at the right posterior pelvis in the same image. There is nodular mass seen at the right lateral and anterior to the distal rectum (image 80/2) measuring 3.1 cm. Musculoskeletal: Grade 1 anterolisthesis at L4-L5 with severe loss of the disc space and prominent marginal osteophytes at the same level. Minimal retrolisthesis at T12-L1 with loss of the disc space and prominent marginal osteophytes. Chronic compression fracture of the T11 as it was seen on the previous study. IMPRESSION: Extensive lobulated urinary bladder mass at the base of the bladder extending to the right lateral wall. In addition, there are multiple soft tissue masses seen at the left inferior pelvic wall anterolateral inferior rectus, mid pelvis including the right pelvic and right inguinal lymphadenopathy. Largest lymph node at the right inguinal region measures 2.7 x 2 cm and is amenable for percutaneous needle biopsy if clinically warranted. The constellation of these findings may be on the basis of urinary bladder tumor with metastasis. Recurrent and metastatic carcinoma of the prostate cannot be entirely excluded. Recommend urology consultation. Severe right hydronephrosis and hydroureter with thinning of the renal cortex and is new. Severe sigmoid diverticulosis without diverticulitis. Grade 1 spondylolisthesis with severe discogenic and spondylitic changes at L4-L5. Electronically Signed   By: AFrazier RichardsM.D.   On: 08/03/2021 16:12    Microbiology: Results for orders placed or performed during the hospital encounter of 08/15/21  Urine Culture     Status: Abnormal   Collection Time: 08/15/21  1:23 AM   Specimen: Urine, Random  Result Value Ref Range Status   Specimen Description   Final     URINE, RANDOM Performed at [facility], [address] [place], [place] [address]    Special Requests   Final    NONE Performed at [facility], [address] [place], [place] [address]    Culture MULTIPLE SPECIES PRESENT, SUGGEST RECOLLECTION (A)  Final   Report Status 08/16/2021 FINAL  Final  Blood culture (routine x 2)     Status: None (Preliminary result)   Collection Time: 08/15/21  3:20 AM   Specimen: BLOOD  Result Value Ref Range Status   Specimen Description BLOOD LEFT WRIST  Final   Special Requests   Final    BOTTLES DRAWN AEROBIC AND ANAEROBIC Blood Culture results may not be optimal due to an inadequate volume of blood received in culture bottles   Culture   Final    NO GROWTH 3 DAYS Performed at [facility], [address] [place], [place] [address]    Report Status PENDING  Incomplete  Blood culture (routine x 2)     Status: None (Preliminary result)   Collection Time: 08/15/21  3:20 AM   Specimen: BLOOD  Result Value Ref Range Status   Specimen Description BLOOD RIGHT [facility]  Final   Special Requests   Final    BOTTLES DRAWN AEROBIC AND ANAEROBIC Blood Culture results may not be optimal due to an inadequate volume of blood received in culture bottles   Culture   Final    NO GROWTH 3 DAYS Performed at [facility], [address] [place],  [address]    Report Status PENDING  Incomplete    Labs: CBC: Recent Labs  Lab 08/14/21 2141 08/16/21 0416 08/17/21 0327 08/18/21 0721  WBC 0.9*  0.9* 0.5* 0.9* 1.0*  NEUTROABS 0.4*  --  0.4* 0.6*  HGB 8.4*  8.3* 6.3* 7.4* 7.2*  HCT 26.4*  26.6* 19.9* 23.4* 22.0*  MCV 98.1  99.3 99.0 97.9 95.7  PLT 133*  131* 82* 69* 67*   Basic Metabolic Panel: Recent Labs  Lab 08/14/21 2141 08/16/21 0416 08/17/21 0327 08/18/21 0721  NA 137 138 140 137  K 4.2 4.2 4.0 3.7  CL 105 108 113* 111  CO2 '27 26 24 22  '$ GLUCOSE 140* 95 96 130*  BUN 24* '21 19 16  '$ CREATININE 1.10  1.14 1.02 1.00  CALCIUM 9.0 7.9* 7.4* 7.5*   Liver Function Tests: Recent Labs  Lab 08/14/21 2141  AST 35  ALT 41  ALKPHOS 46  BILITOT 0.9  PROT 6.1*  ALBUMIN 3.6   CBG: Recent Labs  Lab 08/16/21 0751  GLUCAP 105*    Discharge time spent: greater than 30 minutes.  Signed: Berle Mull, MD [facility] 08/18/2021

## 2021-08-18 NOTE — Consult Note (Signed)
Pharmacy Antibiotic Note  Nicolas Woodward is a 86 y.o. male admitted on 08/15/2021 with UTI. Patient was on ceftriaxone 1g IV q24h now with new neutropenic fever. Pharmacy has been consulted for cefepime dosing.  Plan: Cefepime 2 g IV q8h per indication and renal function Monitor clinical picture and renal function F/U C&S, abx deescalation / LOT   Height: 6' (182.9 cm) Weight: 104.3 kg (230 lb) IBW/kg (Calculated) : 77.6  Temp (24hrs), Avg:99.4 F (37.4 C), Min:98.5 F (36.9 C), Max:100.9 F (38.3 C)  Recent Labs  Lab 08/14/21 2141 08/16/21 0416 08/17/21 0327 08/18/21 0721  WBC 0.9*  0.9* 0.5* 0.9*  --   CREATININE 1.10 1.14 1.02 1.00    Estimated Creatinine Clearance: 68.7 mL/min (by C-G formula based on SCr of 1 mg/dL).    Allergies  Allergen Reactions   Simvastatin Other (See Comments)    myalgia    Antimicrobials this admission: 7/5 ceftriaxone >> 7/8 7/8 cefepime >>   Dose adjustments this admission:  Microbiology results: 7/8 BCx: Pending 7/5 BCx: NG x 3 days  7/5 UCx: multiple species    Thank you for allowing pharmacy to be a part of this patient's care.  Darnelle Bos, PharmD 08/18/2021 7:59 AM

## 2021-08-20 LAB — CULTURE, BLOOD (ROUTINE X 2)
Culture: NO GROWTH
Culture: NO GROWTH

## 2021-08-23 LAB — CULTURE, BLOOD (ROUTINE X 2)
Culture: NO GROWTH
Culture: NO GROWTH
Special Requests: ADEQUATE

## 2021-09-04 ENCOUNTER — Inpatient Hospital Stay: Payer: Medicare Other

## 2021-09-04 ENCOUNTER — Inpatient Hospital Stay: Payer: Medicare Other | Attending: Oncology | Admitting: Oncology

## 2021-09-04 ENCOUNTER — Other Ambulatory Visit: Payer: Self-pay

## 2021-09-04 ENCOUNTER — Inpatient Hospital Stay (HOSPITAL_BASED_OUTPATIENT_CLINIC_OR_DEPARTMENT_OTHER): Payer: Medicare Other | Admitting: Hospice and Palliative Medicine

## 2021-09-04 ENCOUNTER — Other Ambulatory Visit: Payer: Self-pay | Admitting: *Deleted

## 2021-09-04 ENCOUNTER — Encounter: Payer: Self-pay | Admitting: Oncology

## 2021-09-04 VITALS — BP 104/58 | HR 88 | Temp 97.2°F | Resp 18 | Ht 72.0 in | Wt 235.0 lb

## 2021-09-04 DIAGNOSIS — R110 Nausea: Secondary | ICD-10-CM | POA: Diagnosis not present

## 2021-09-04 DIAGNOSIS — D696 Thrombocytopenia, unspecified: Secondary | ICD-10-CM | POA: Diagnosis not present

## 2021-09-04 DIAGNOSIS — D61818 Other pancytopenia: Secondary | ICD-10-CM | POA: Diagnosis not present

## 2021-09-04 DIAGNOSIS — C61 Malignant neoplasm of prostate: Secondary | ICD-10-CM | POA: Diagnosis present

## 2021-09-04 DIAGNOSIS — D709 Neutropenia, unspecified: Secondary | ICD-10-CM

## 2021-09-04 DIAGNOSIS — L03032 Cellulitis of left toe: Secondary | ICD-10-CM

## 2021-09-04 LAB — CBC WITH DIFFERENTIAL/PLATELET
Abs Immature Granulocytes: 0.01 10*3/uL (ref 0.00–0.07)
Basophils Absolute: 0 10*3/uL (ref 0.0–0.1)
Basophils Relative: 0 %
Eosinophils Absolute: 0 10*3/uL (ref 0.0–0.5)
Eosinophils Relative: 3 %
HCT: 17.8 % — ABNORMAL LOW (ref 39.0–52.0)
Hemoglobin: 5.9 g/dL — ABNORMAL LOW (ref 13.0–17.0)
Immature Granulocytes: 1 %
Lymphocytes Relative: 35 %
Lymphs Abs: 0.4 10*3/uL — ABNORMAL LOW (ref 0.7–4.0)
MCH: 32.2 pg (ref 26.0–34.0)
MCHC: 33.1 g/dL (ref 30.0–36.0)
MCV: 97.3 fL (ref 80.0–100.0)
Monocytes Absolute: 0.2 10*3/uL (ref 0.1–1.0)
Monocytes Relative: 17 %
Neutro Abs: 0.5 10*3/uL — ABNORMAL LOW (ref 1.7–7.7)
Neutrophils Relative %: 44 %
Platelets: 119 10*3/uL — ABNORMAL LOW (ref 150–400)
RBC: 1.83 MIL/uL — ABNORMAL LOW (ref 4.22–5.81)
RDW: 15.9 % — ABNORMAL HIGH (ref 11.5–15.5)
Smear Review: NORMAL
WBC: 1.2 10*3/uL — CL (ref 4.0–10.5)
nRBC: 1.7 % — ABNORMAL HIGH (ref 0.0–0.2)

## 2021-09-04 LAB — PREPARE RBC (CROSSMATCH)

## 2021-09-04 MED ORDER — ONDANSETRON HCL 4 MG PO TABS: 8.0000 mg | ORAL_TABLET | Freq: Once | ORAL | Status: DC

## 2021-09-04 MED ORDER — ONDANSETRON HCL 4 MG PO TABS
8.0000 mg | ORAL_TABLET | Freq: Once | ORAL | Status: AC
  Administered 2021-09-04: 8 mg via ORAL
  Filled 2021-09-04: qty 2

## 2021-09-04 NOTE — Progress Notes (Signed)
Symptom Management and [employer] at [facility] [phone]) [phone] [phone]) [phone]  Patient Care Team: Ginger Organ., MD as PCP - General (Internal Medicine)   Name of the patient: Nicolas Woodward  [id number]  [birth year]   Date of visit: 09/04/21  Reason for Consult: Nicolas Woodward is a 86 y.o. male with multiple medical problems including metastatic prostate cancer, pancytopenia with transfusion dependence.  Patient has been historically followed by [facility] oncology most recently on treatment with Pluvicto the patient has been on multiple previous lines of chemotherapy dating back 20+ years.  Worsening cytopenias resulted in discontinuation of Pluvicto.  Patient was referred to malignant hematology for further evaluation.  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  Patient was ultimately discharged home with hospice care.  Patient requested to be seen in our cancer center for consideration of resumption of transfusions.  Interval History: Patient was accompanied by his daughter.  Patient reports that he is feeling poorly over the past several days with worsening weakness, paleness.  He has some nausea.  Denies any neurologic complaints. Denies recent fevers or illnesses. Denies any easy bleeding or bruising. Reports fair appetite and denies weight loss. Denies chest pain. Denies any nausea, vomiting, constipation, or diarrhea. Denies urinary complaints. Patient offers no further specific complaints today.  SOCIAL HISTORY:     reports that he has never smoked. He has never used smokeless tobacco. He reports that he does not drink alcohol.  Patient is married and lives at home with his wife and daughter.  He previously worked as a Dealer.  ADVANCE DIRECTIVES:    CODE STATUS:    PAST MEDICAL HISTORY: Past Medical History:  Diagnosis Date   Anemia    Atherosclerosis    Back pain     High coronary artery calcium score    Hyperlipidemia    Hypogonadism in male    Lymphadenopathy    OA (osteoarthritis)    Obesity    Osteopenia    Prostate cancer ([place])    Prostate cancer metastatic to pelvis (HCC)    Rash, skin    LEG   Thoracic aortic aneurysm (TAA) (HCC)     PAST SURGICAL HISTORY:  Past Surgical History:  Procedure Laterality Date   BELPHAROPTOSIS REPAIR     BILATERAL KNEE ARTHROSCOPY     HYDRONEPHROSIS Right    LYMPHADENOPATHY Right     HEMATOLOGY/ONCOLOGY HISTORY:  Oncology History   No history exists.    ALLERGIES:  is allergic to simvastatin.  MEDICATIONS:  Current Outpatient Medications  Medication Sig Dispense Refill   amoxicillin-clavulanate (AUGMENTIN) 500-125 MG tablet Take 1 tablet by mouth 3 (three) times daily.     docusate sodium (COLACE) 100 MG capsule Take 1 capsule (100 mg total) by mouth 2 (two) times daily as needed. 60 capsule 0   LORazepam (ATIVAN) 0.5 MG tablet Take 1 tablet (0.5 mg total) by mouth every 8 (eight) hours as needed for anxiety (also for refractory nausea not responding to reglan and phenergan). (Patient not taking: Reported on 09/04/2021) 30 tablet 0   metoCLOPramide (REGLAN) 10 MG tablet Take 1 tablet (10 mg total) by mouth every 6 (six) hours as needed for nausea or vomiting. (Patient not taking: Reported on 09/04/2021) 40 tablet 1   Morphine Sulfate (MORPHINE CONCENTRATE) 10 mg / 0.5 ml concentrated solution Take 0.25 mLs (5 mg total) by mouth every 2 (two) hours as needed for severe pain, moderate  pain or shortness of breath. (Patient not taking: Reported on 09/04/2021) 30 mL 0   polyethylene glycol (MIRALAX / GLYCOLAX) 17 g packet Take 17 g by mouth daily. (Patient not taking: Reported on 09/04/2021) 14 each 0   prochlorperazine (COMPAZINE) 10 MG tablet Take 10 mg by mouth every 4 (four) hours as needed. (Patient not taking: Reported on 09/04/2021)     promethazine (PHENERGAN) 25 MG suppository Place 1 suppository (25 mg  total) rectally every 6 (six) hours as needed for refractory nausea / vomiting (not responding to reglan). 12 each 0   SENNA-PLUS 8.6-50 MG tablet SMARTSIG:2 Tablet(s) By Mouth Every Evening (Patient not taking: Reported on 09/04/2021)     traZODone (DESYREL) 50 MG tablet Take 1 tablet (50 mg total) by mouth at bedtime as needed for sleep. (Patient not taking: Reported on 09/04/2021) 30 tablet 0   Current Facility-Administered Medications  Medication Dose Route Frequency Provider Last Rate Last Admin   ondansetron (ZOFRAN) tablet 8 mg  8 mg Oral Once Lloyd Huger, MD        VITAL SIGNS: There were no vitals taken for this visit. There were no vitals filed for this visit.  Estimated body mass index is 31.87 kg/m as calculated from the following:   Height as of an earlier encounter on 09/04/21: 6' (1.829 m).   Weight as of an earlier encounter on 09/04/21: 235 lb (106.6 kg).  LABS: CBC:    Component Value Date/Time   WBC 1.2 (LL) 09/04/2021 1413   HGB 5.9 (L) 09/04/2021 1413   HCT 17.8 (L) 09/04/2021 1413   PLT 119 (L) 09/04/2021 1413   MCV 97.3 09/04/2021 1413   NEUTROABS 0.5 (L) 09/04/2021 1413   LYMPHSABS 0.4 (L) 09/04/2021 1413   MONOABS 0.2 09/04/2021 1413   EOSABS 0.0 09/04/2021 1413   BASOSABS 0.0 09/04/2021 1413   Comprehensive Metabolic Panel:    Component Value Date/Time   NA 137 08/18/2021 0721   K 3.7 08/18/2021 0721   CL 111 08/18/2021 0721   CO2 22 08/18/2021 0721   BUN 16 08/18/2021 0721   CREATININE 1.00 08/18/2021 0721   GLUCOSE 130 (H) 08/18/2021 0721   CALCIUM 7.5 (L) 08/18/2021 0721   AST 35 08/14/2021 2141   ALT 41 08/14/2021 2141   ALKPHOS 46 08/14/2021 2141   BILITOT 0.9 08/14/2021 2141   PROT 6.1 (L) 08/14/2021 2141   ALBUMIN 3.6 08/14/2021 2141    RADIOGRAPHIC STUDIES: DG ABD ACUTE 2+V W 1V CHEST  Result Date: 08/18/2021 CLINICAL DATA:  Fever.  Small-bowel obstruction. EXAM: DG ABDOMEN ACUTE WITH 1 VIEW CHEST COMPARISON:  08/16/2021  FINDINGS: Low lung volumes. The cardio pericardial silhouette is enlarged. Interstitial markings are diffusely coarsened with chronic features. Basilar atelectasis bilaterally, left greater than right with possible tiny left effusion. Lucency under the right hemidiaphragm raises the question of intraperitoneal free air. Diffuse gaseous small bowel distension suggests obstruction and is slightly progressive since 08/15/2021 exam. Numerous surgical clips overlie the pelvis. IMPRESSION: 1. Low lung volumes with bibasilar atelectasis. 2. Lucency under the right hemidiaphragm. Intraperitoneal free air not excluded. CT scan of the abdomen and pelvis could be used to further evaluate as clinically warranted. 3. Interval increase in gaseous small bowel dilatation since 08/16/2021. Evolving small bowel obstruction a concern. This could also be further assessed at CT. These results will be called to the ordering clinician or representative by the Radiologist Assistant, and communication documented in the PACS or [employer]. Electronically Signed  By: Misty Stanley M.D.   On: 08/18/2021 12:23   DG Abd 2 Views  Result Date: 08/16/2021 CLINICAL DATA:  Small bowel obstruction EXAM: ABDOMEN - 2 VIEW COMPARISON:  Yesterday FINDINGS: Less pronounced small bowel dilatation with increased colonic gas compared to scanogram on CT yesterday. No new or progressive abnormality. Pelvic lymphadenectomy clips. IMPRESSION: Likely improved small bowel obstruction. Electronically Signed   By: Jorje Guild M.D.   On: 08/16/2021 07:36   DG Abd 2 Views  Result Date: 08/15/2021 CLINICAL DATA:  Small-bowel obstruction EXAM: ABDOMEN - 2 VIEW COMPARISON:  None Available. FINDINGS: There are a few air-filled mildly dilated loops of small bowel. Likely similar in comparison to CT topogram. No free air. Residual contrast within dilated left renal collecting system and ureter. IMPRESSION: Persistent small-bowel obstruction with likely  similar distension to CT. Residual contrast within dilated left renal collecting system and ureter. Electronically Signed   By: Macy Mis M.D.   On: 08/15/2021 11:27   CT ABDOMEN PELVIS W CONTRAST  Result Date: 08/15/2021 CLINICAL DATA:  Left lower quadrant abdominal pain. EXAM: CT ABDOMEN AND PELVIS WITH CONTRAST TECHNIQUE: Multidetector CT imaging of the abdomen and pelvis was performed using the standard protocol following bolus administration of intravenous contrast. RADIATION DOSE REDUCTION: This exam was performed according to the departmental dose-optimization program which includes automated exposure control, adjustment of the mA and/or kV according to patient size and/or use of iterative reconstruction technique. CONTRAST:  155m OMNIPAQUE IOHEXOL 300 MG/ML  SOLN COMPARISON:  August 03, 2021 FINDINGS: Lower chest: No acute abnormality. Hepatobiliary: Small, stable hepatic cysts are seen. No gallstones, gallbladder wall thickening, or biliary dilatation. Pancreas: Unremarkable. No pancreatic ductal dilatation or surrounding inflammatory changes. Spleen: Multiple small, stable heterogeneous low-attenuation splenic lesions are noted. Adrenals/Urinary Tract: The right adrenal gland is normal in appearance. Nodularity of the left adrenal gland is seen. The kidneys are normal in size, bilaterally. Marked severity diffuse renal cortical thinning is seen on the right. Small, stable simple cysts are seen within the left kidney. There is stable marked severity right-sided hydronephrosis and hydroureter with dilatation of the intrarenal calices. No renal calculi are identified. A stable approximately 9.5 cm x 5.7 cm heterogeneous lobulated soft tissue mass is seen within the posterior and lateral aspects of the urinary bladder. Stomach/Bowel: Stomach is within normal limits. The appendix is not clearly identified. Numerous dilated small bowel loops are seen within the abdomen and pelvis (maximum small bowel  diameter of approximately 4.0 cm). An abrupt transition zone is seen within the anterior aspect of the lower abdomen, along the midline (axial CT images 54 through 58, CT series 2). Noninflamed diverticula are seen throughout the sigmoid colon. Vascular/Lymphatic: Aortic atherosclerosis. Stable pelvic and right inguinal lymphadenopathy is seen. Reproductive: The prostate gland is surgically absent. Other: No abdominal wall hernia or abnormality. No abdominopelvic ascites. Musculoskeletal: There is grade 2 anterolisthesis of the L4 vertebral body on L5. Multilevel degenerative changes are seen throughout the remainder of the lumbar spine. IMPRESSION: 1. Distal small bowel obstruction with a transition zone seen within the anterior aspect of the lower abdomen, along the midline. 2. Stable marked severity right-sided hydronephrosis and hydroureter with dilatation of the intrarenal calices. 3. Stable lobulated urinary bladder mass consistent with an underlying neoplasm. Further evaluation with soft tissue sampling is recommended. 4. Sigmoid diverticulosis. 5. Stable pelvic and right inguinal lymphadenopathy. 6. Grade 2 anterolisthesis of the L4 vertebral body on L5. 7. Low attenuation splenic lesions which may represent  splenic hemangiomas. An underlying neoplastic process cannot be excluded. Further evaluation with nuclear medicine PET/CT is recommended. 8. Aortic atherosclerosis. Aortic Atherosclerosis (ICD10-I70.0). Electronically Signed   By: Virgina Norfolk M.D.   On: 08/15/2021 01:49    PERFORMANCE STATUS (ECOG) : 2 - Symptomatic, <50% confined to bed  Review of Systems Unless otherwise noted, a complete review of systems is negative.  Physical Exam General: Frail appearing, pale Pulmonary: Unlabored Extremities: no edema, no joint deformities Skin: no rashes Neurological: Weakness but otherwise nonfocal  Assessment and Plan- Patient is a 86 y.o. male with multiple medical problems including  metastatic prostate cancer, pancytopenia with transfusion dependence.  Patient has been historically followed by [facility] oncology most recently on treatment with Pluvicto the patient has been on multiple previous lines of chemotherapy dating back 20+ years.  Patient was referred to hospice but transferred oncology care locally for consideration of resumption of transfusions.   Discussed goals with patient and daughter.  Patient states that he has had progressive weakness and general malaise, likely stemming from worsening pancytopenia's.  He had previously been referred to malignant hematology at [facility] for further work-up.  Patient states that he is not interested in further work-up or treatment of the cancer at this point but would like to resume regular blood transfusions as he feels that these positively contribute to his quality of life.  As result, patient recognizes that he will have to revoke hospice services.  I did call and update Dr. Jewel Baize, his hospice physician.  Patient is being scheduled for transfusion tomorrow.   Patient expressed understanding and was in agreement with this plan. He also understands that He can call clinic at any time with any questions, concerns, or complaints.   Thank you for allowing me to participate in the care of this very pleasant patient.   Time Total: 15 minutes  Visit consisted of counseling and education dealing with the complex and emotionally intense issues of symptom management in the setting of serious illness.Greater than 50%  of this time was spent counseling and coordinating care related to the above assessment and plan.  Signed by: Altha Harm, PhD, NP-C

## 2021-09-05 ENCOUNTER — Inpatient Hospital Stay: Payer: Medicare Other

## 2021-09-05 DIAGNOSIS — C61 Malignant neoplasm of prostate: Secondary | ICD-10-CM | POA: Diagnosis not present

## 2021-09-05 DIAGNOSIS — D709 Neutropenia, unspecified: Secondary | ICD-10-CM | POA: Diagnosis not present

## 2021-09-05 DIAGNOSIS — D61818 Other pancytopenia: Secondary | ICD-10-CM | POA: Diagnosis not present

## 2021-09-05 DIAGNOSIS — D696 Thrombocytopenia, unspecified: Secondary | ICD-10-CM | POA: Diagnosis not present

## 2021-09-05 MED ORDER — DIPHENHYDRAMINE HCL 50 MG/ML IJ SOLN
25.0000 mg | Freq: Once | INTRAMUSCULAR | Status: DC
  Filled 2021-09-05: qty 1

## 2021-09-05 MED ORDER — ACETAMINOPHEN 325 MG PO TABS
650.0000 mg | ORAL_TABLET | Freq: Once | ORAL | Status: DC
  Filled 2021-09-05: qty 2

## 2021-09-05 MED ORDER — SODIUM CHLORIDE 0.9% IV SOLUTION
250.0000 mL | Freq: Once | INTRAVENOUS | Status: AC
  Administered 2021-09-05: 250 mL via INTRAVENOUS
  Filled 2021-09-05: qty 250

## 2021-09-05 NOTE — Progress Notes (Signed)
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  INTERVAL HISTORY: Patient is an 86 year old male who was recently under treatment for metastatic prostate cancer as well as being transfusion dependent requiring blood transfusions approximately every 2 weeks for persistent pancytopenia.  Approximately 3 weeks ago he elected to discontinue treatment and enrolled in hospice.  He is in clinic today to discuss coming off hospice and reinitiating transfusion support.  He does not wish to reinitiate treatment for his prostate cancer.  He has significant weakness and fatigue with a decreased performance status.  He has a poor appetite.  He has no neurologic complaints.  He denies any recent fevers.  He has no chest pain, shortness of breath, cough, or hemoptysis.  He denies any nausea, vomiting, constipation, or diarrhea.  He has no urinary complaints.  Patient feels generally terrible, but offers no further specific complaints.  REVIEW OF SYSTEMS:   Review of Systems  Constitutional:  Positive for malaise/fatigue and weight loss. Negative for fever.  Respiratory: Negative.  Negative for cough, hemoptysis and shortness of breath.   Cardiovascular: Negative.  Negative for chest pain and leg swelling.  Gastrointestinal: Negative.  Negative for abdominal pain.  Genitourinary: Negative.  Negative for dysuria and hematuria.  Musculoskeletal: Negative.  Negative for back pain.  Skin: Negative.  Negative for rash.  Neurological:  Positive for weakness. Negative for dizziness, focal weakness and headaches.  Psychiatric/Behavioral: Negative.  The patient is not nervous/anxious.     As per HPI. Otherwise, a complete  review of systems is negative.  PAST MEDICAL HISTORY: Past Medical History:  Diagnosis Date   Anemia    Atherosclerosis    Back pain    High coronary artery calcium score    Hyperlipidemia    Hypogonadism in male    Lymphadenopathy    OA (osteoarthritis)    Obesity    Osteopenia    Prostate cancer (HCC)    Prostate cancer metastatic to pelvis (HCC)    Rash, skin    LEG   Thoracic aortic aneurysm (TAA) (HCC)     PAST SURGICAL HISTORY: Past Surgical History:  Procedure Laterality Date   BELPHAROPTOSIS REPAIR     BILATERAL KNEE ARTHROSCOPY     HYDRONEPHROSIS Right    LYMPHADENOPATHY Right     FAMILY HISTORY: Family History  Problem Relation Age of Onset   Vascular Disease Mother    Cancer Mother    Heart attack Father    Diabetes Father    Healthy Sister    Heart attack Brother    Diabetes Paternal Grandmother    Other Paternal Grandfather        OLD AGE   Healthy Brother    Healthy Daughter     ADVANCED DIRECTIVES (Y/N):  N  HEALTH MAINTENANCE: Social History   Tobacco Use   Smoking status: Never   Smokeless tobacco: Never  Substance Use Topics   Alcohol use: No    Alcohol/week: 0.0 standard drinks of alcohol     Colonoscopy:  PAP:  Bone density:  Lipid panel:  Allergies  Allergen Reactions   Simvastatin Other (See Comments)    myalgia    Current Outpatient Medications  Medication Sig Dispense Refill   amoxicillin-clavulanate (AUGMENTIN) 500-125 MG tablet Take  1 tablet by mouth 3 (three) times daily.     docusate sodium (COLACE) 100 MG capsule Take 1 capsule (100 mg total) by mouth 2 (two) times daily as needed. 60 capsule 0   promethazine (PHENERGAN) 25 MG suppository Place 1 suppository (25 mg total) rectally every 6 (six) hours as needed for refractory nausea / vomiting (not responding to reglan). 12 each 0   LORazepam (ATIVAN) 0.5 MG tablet Take 1 tablet (0.5 mg total) by mouth every 8 (eight) hours as needed for anxiety (also for  refractory nausea not responding to reglan and phenergan). (Patient not taking: Reported on 09/04/2021) 30 tablet 0   metoCLOPramide (REGLAN) 10 MG tablet Take 1 tablet (10 mg total) by mouth every 6 (six) hours as needed for nausea or vomiting. (Patient not taking: Reported on 09/04/2021) 40 tablet 1   Morphine Sulfate (MORPHINE CONCENTRATE) 10 mg / 0.5 ml concentrated solution Take 0.25 mLs (5 mg total) by mouth every 2 (two) hours as needed for severe pain, moderate pain or shortness of breath. (Patient not taking: Reported on 09/04/2021) 30 mL 0   polyethylene glycol (MIRALAX / GLYCOLAX) 17 g packet Take 17 g by mouth daily. (Patient not taking: Reported on 09/04/2021) 14 each 0   prochlorperazine (COMPAZINE) 10 MG tablet Take 10 mg by mouth every 4 (four) hours as needed. (Patient not taking: Reported on 09/04/2021)     SENNA-PLUS 8.6-50 MG tablet SMARTSIG:2 Tablet(s) By Mouth Every Evening (Patient not taking: Reported on 09/04/2021)     traZODone (DESYREL) 50 MG tablet Take 1 tablet (50 mg total) by mouth at bedtime as needed for sleep. (Patient not taking: Reported on 09/04/2021) 30 tablet 0   No current facility-administered medications for this visit.    OBJECTIVE: Vitals:   09/04/21 1343  BP: (!) 104/58  Pulse: 88  Resp: 18  Temp: (!) 97.2 F (36.2 C)  SpO2: 96%     Body mass index is 31.87 kg/m.    ECOG FS:3 - Symptomatic, >50% confined to bed  General: Ill-appearing, no acute distress. Eyes: Pink conjunctiva, anicteric sclera. HEENT: Normocephalic, moist mucous membranes. Lungs: No audible wheezing or coughing. Heart: Regular rate and rhythm. Abdomen: Soft, nontender, no obvious distention. Musculoskeletal: No edema, cyanosis, or clubbing. Neuro: Alert, answering all questions appropriately. Cranial nerves grossly intact. Skin: No rashes or petechiae noted. Psych: Normal affect. Lymphatics: No cervical, calvicular, axillary or inguinal LAD.   LAB RESULTS:  Lab Results   Component Value Date   NA 137 08/18/2021   K 3.7 08/18/2021   CL 111 08/18/2021   CO2 22 08/18/2021   GLUCOSE 130 (H) 08/18/2021   BUN 16 08/18/2021   CREATININE 1.00 08/18/2021   CALCIUM 7.5 (L) 08/18/2021   PROT 6.1 (L) 08/14/2021   ALBUMIN 3.6 08/14/2021   AST 35 08/14/2021   ALT 41 08/14/2021   ALKPHOS 46 08/14/2021   BILITOT 0.9 08/14/2021   GFRNONAA >60 08/18/2021   GFRAA >60 11/15/2015    Lab Results  Component Value Date   WBC 1.2 (LL) 09/04/2021   NEUTROABS 0.5 (L) 09/04/2021   HGB 5.9 (L) 09/04/2021   HCT 17.8 (L) 09/04/2021   MCV 97.3 09/04/2021   PLT 119 (L) 09/04/2021     STUDIES: DG ABD ACUTE 2+V W 1V CHEST  Result Date: 08/18/2021 CLINICAL DATA:  Fever.  Small-bowel obstruction. EXAM: DG ABDOMEN ACUTE WITH 1 VIEW CHEST COMPARISON:  08/16/2021 FINDINGS: Low lung volumes. The cardio pericardial silhouette is enlarged. Interstitial markings  are diffusely coarsened with chronic features. Basilar atelectasis bilaterally, left greater than right with possible tiny left effusion. Lucency under the right hemidiaphragm raises the question of intraperitoneal free air. Diffuse gaseous small bowel distension suggests obstruction and is slightly progressive since 08/15/2021 exam. Numerous surgical clips overlie the pelvis. IMPRESSION: 1. Low lung volumes with bibasilar atelectasis. 2. Lucency under the right hemidiaphragm. Intraperitoneal free air not excluded. CT scan of the abdomen and pelvis could be used to further evaluate as clinically warranted. 3. Interval increase in gaseous small bowel dilatation since 08/16/2021. Evolving small bowel obstruction a concern. This could also be further assessed at CT. These results will be called to the ordering clinician or representative by the Radiologist Assistant, and communication documented in the PACS or [employer]. Electronically Signed   By: Misty Stanley M.D.   On: 08/18/2021 12:23   DG Abd 2 Views  Result Date:  08/16/2021 CLINICAL DATA:  Small bowel obstruction EXAM: ABDOMEN - 2 VIEW COMPARISON:  Yesterday FINDINGS: Less pronounced small bowel dilatation with increased colonic gas compared to scanogram on CT yesterday. No new or progressive abnormality. Pelvic lymphadenectomy clips. IMPRESSION: Likely improved small bowel obstruction. Electronically Signed   By: Jorje Guild M.D.   On: 08/16/2021 07:36   DG Abd 2 Views  Result Date: 08/15/2021 CLINICAL DATA:  Small-bowel obstruction EXAM: ABDOMEN - 2 VIEW COMPARISON:  None Available. FINDINGS: There are a few air-filled mildly dilated loops of small bowel. Likely similar in comparison to CT topogram. No free air. Residual contrast within dilated left renal collecting system and ureter. IMPRESSION: Persistent small-bowel obstruction with likely similar distension to CT. Residual contrast within dilated left renal collecting system and ureter. Electronically Signed   By: Macy Mis M.D.   On: 08/15/2021 11:27   CT ABDOMEN PELVIS W CONTRAST  Result Date: 08/15/2021 CLINICAL DATA:  Left lower quadrant abdominal pain. EXAM: CT ABDOMEN AND PELVIS WITH CONTRAST TECHNIQUE: Multidetector CT imaging of the abdomen and pelvis was performed using the standard protocol following bolus administration of intravenous contrast. RADIATION DOSE REDUCTION: This exam was performed according to the departmental dose-optimization program which includes automated exposure control, adjustment of the mA and/or kV according to patient size and/or use of iterative reconstruction technique. CONTRAST:  136m OMNIPAQUE IOHEXOL 300 MG/ML  SOLN COMPARISON:  August 03, 2021 FINDINGS: Lower chest: No acute abnormality. Hepatobiliary: Small, stable hepatic cysts are seen. No gallstones, gallbladder wall thickening, or biliary dilatation. Pancreas: Unremarkable. No pancreatic ductal dilatation or surrounding inflammatory changes. Spleen: Multiple small, stable heterogeneous low-attenuation splenic  lesions are noted. Adrenals/Urinary Tract: The right adrenal gland is normal in appearance. Nodularity of the left adrenal gland is seen. The kidneys are normal in size, bilaterally. Marked severity diffuse renal cortical thinning is seen on the right. Small, stable simple cysts are seen within the left kidney. There is stable marked severity right-sided hydronephrosis and hydroureter with dilatation of the intrarenal calices. No renal calculi are identified. A stable approximately 9.5 cm x 5.7 cm heterogeneous lobulated soft tissue mass is seen within the posterior and lateral aspects of the urinary bladder. Stomach/Bowel: Stomach is within normal limits. The appendix is not clearly identified. Numerous dilated small bowel loops are seen within the abdomen and pelvis (maximum small bowel diameter of approximately 4.0 cm). An abrupt transition zone is seen within the anterior aspect of the lower abdomen, along the midline (axial CT images 54 through 58, CT series 2). Noninflamed diverticula are seen throughout the sigmoid colon.  Vascular/Lymphatic: Aortic atherosclerosis. Stable pelvic and right inguinal lymphadenopathy is seen. Reproductive: The prostate gland is surgically absent. Other: No abdominal wall hernia or abnormality. No abdominopelvic ascites. Musculoskeletal: There is grade 2 anterolisthesis of the L4 vertebral body on L5. Multilevel degenerative changes are seen throughout the remainder of the lumbar spine. IMPRESSION: 1. Distal small bowel obstruction with a transition zone seen within the anterior aspect of the lower abdomen, along the midline. 2. Stable marked severity right-sided hydronephrosis and hydroureter with dilatation of the intrarenal calices. 3. Stable lobulated urinary bladder mass consistent with an underlying neoplasm. Further evaluation with soft tissue sampling is recommended. 4. Sigmoid diverticulosis. 5. Stable pelvic and right inguinal lymphadenopathy. 6. Grade 2 anterolisthesis  of the L4 vertebral body on L5. 7. Low attenuation splenic lesions which may represent splenic hemangiomas. An underlying neoplastic process cannot be excluded. Further evaluation with nuclear medicine PET/CT is recommended. 8. Aortic atherosclerosis. Aortic Atherosclerosis (ICD10-I70.0). Electronically Signed   By: Virgina Norfolk M.D.   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  PLAN:    Transfusion dependent pancytopenia: Patient's pancytopenia by report is secondary to chemotherapy.  He is now transfusion dependent and previously was requiring blood transfusion approximately every 2 weeks.  Patient has elected to discontinue hospice and resume transfusions.  His hemoglobin is 5.9 today, therefore he will return to clinic tomorrow for 2 units of packed red blood cells.  Return to clinic in 1 week for further evaluation and continuation of blood if needed. Metastatic prostate cancer: Patient does not wish any further treatments. Neutropenia: Patient has an [place] of 0.5 today.  This appears to be at approximate baseline.  No intervention needed. Thrombocytopenia: Patient's platelet count is 119 today which is now trending up.  Monitor.  I spent a total of 60 minutes reviewing chart data, face-to-face evaluation with the patient, counseling and coordination of care as detailed above.   Patient expressed understanding and was in agreement with this plan. He also understands that He can call clinic at any time with any questions, concerns, or complaints.    Cancer Staging  No matching staging information was found for the patient.  Lloyd Huger, MD   09/05/2021 6:41 AM

## 2021-09-05 NOTE — Progress Notes (Signed)
Pt refuses to take tylenol 650 mg PO and benadryl 25 mg IV prior to blood transfusion as ordered. Dr Grayland Ormond made aware, per MD he advices pt to take pre-meds prior to blood transfusion but okay to proceed with blood transfusion without receiving pre-meds against medical advice if pt refuses. Blood transfusion started at 0858 per protocol. First transfusion completed at 1048, pt tolerated first unit well with no signs of complications. Second unit of RBCs completed at 1245. Pt tolerated transfusion well with no signs of complications. VSS. Pt stable at discharge.   Aariel Ems [employer]

## 2021-09-05 NOTE — Patient Instructions (Signed)
Blood Transfusion, Adult A blood transfusion is a procedure in which you receive blood or a type of blood cell (blood component) through an IV. You may need a blood transfusion when your blood level is low. This may result from a bleeding disorder, illness, injury, or surgery. The blood may come from a donor. You may also be able to donate blood for yourself (autologous blood donation) before a planned surgery. The blood given in a transfusion is made up of different blood components. You may receive: Red blood cells. These carry oxygen to the cells in the body. Platelets. These help your blood to clot. Plasma. This is the liquid part of your blood. It carries proteins and other substances throughout the body. White blood cells. These help you fight infections. If you have hemophilia or another clotting disorder, you may also receive other types of blood products. Tell a health care provider about: Any blood disorders you have. Any previous reactions you have had during a blood transfusion. Any allergies you have. All medicines you are taking, including vitamins, herbs, eye drops, creams, and over-the-counter medicines. Any surgeries you have had. Any medical conditions you have, including any recent fever or cold symptoms. Whether you are pregnant or may be pregnant. What are the risks? Generally, this is a safe procedure. However, problems may occur. The most common problems include: A mild allergic reaction, such as red, swollen areas of skin (hives) and itching. Fever or chills. This may be the body's response to new blood cells received. This may occur during or up to 4 hours after the transfusion. More serious problems may include: Transfusion-associated circulatory overload (TACO), or too much fluid in the lungs. This may cause breathing problems. A serious allergic reaction, such as difficulty breathing or swelling around the face and lips. Transfusion-related acute lung injury  (TRALI), which causes breathing difficulty and low oxygen in the blood. This can occur within hours of the transfusion or several days later. Iron overload. This can happen after receiving many blood transfusions over a period of time. Infection or virus being transmitted. This is rare because donated blood is carefully tested before it is given. Hemolytic transfusion reaction. This is rare. It happens when your body's defense system (immune system)tries to attack the new blood cells. Symptoms may include fever, chills, nausea, low blood pressure, and low back or chest pain. Transfusion-associated graft-versus-host disease (TAGVHD). This is rare. It happens when donated cells attack your body's healthy tissues. What happens before the procedure? Medicines Ask your health care provider about: Changing or stopping your regular medicines. This is especially important if you are taking diabetes medicines or blood thinners. Taking medicines such as aspirin and ibuprofen. These medicines can thin your blood. Do not take these medicines unless your health care provider tells you to take them. Taking over-the-counter medicines, vitamins, herbs, and supplements. General instructions Follow instructions from your health care provider about eating and drinking restrictions. You will have a blood test to determine your blood type. This is necessary to know what kind of blood your body will accept and to match it to the donor blood. If you are going to have a planned surgery, you may be able to do an autologous blood donation. This may be done in case you need to have a transfusion. You will have your temperature, blood pressure, and pulse monitored before the transfusion. If you have had an allergic reaction to a transfusion in the past, you may be given medicine to help prevent   a reaction. This medicine may be given to you by mouth (orally) or through an IV. Set aside time for the blood transfusion. This  procedure generally takes 1-4 hours to complete. What happens during the procedure?  An IV will be inserted into one of your veins. The bag of donated blood will be attached to your IV. The blood will then enter through your vein. Your temperature, blood pressure, and pulse will be monitored regularly during the transfusion. This monitoring is done to detect early signs of a transfusion reaction. Tell your nurse right away if you have any of these symptoms during the transfusion: Shortness of breath or trouble breathing. Chest or back pain. Fever or chills. Hives or itching. If you have any signs or symptoms of a reaction, your transfusion will be stopped and you may be given medicine. When the transfusion is complete, your IV will be removed. Pressure may be applied to the IV site for a few minutes. A bandage (dressing)will be applied. The procedure may vary among health care providers and hospitals. What happens after the procedure? Your temperature, blood pressure, pulse, breathing rate, and blood oxygen level will be monitored until you leave the hospital or clinic. Your blood may be tested to see how you are responding to the transfusion. You may be warmed with fluids or blankets to maintain a normal body temperature. If you receive your blood transfusion in an outpatient setting, you will be told whom to contact to report any reactions. Where to find more information For more information on blood transfusions, visit the [employer]: redcross.org Summary A blood transfusion is a procedure in which you receive blood or a type of blood cell (blood component) through an IV. The blood you receive may come from a donor or be donated by yourself (autologous blood donation) before a planned surgery. The blood given in a transfusion is made up of different blood components. You may receive red blood cells, platelets, plasma, or white blood cells depending on the condition treated. Your  temperature, blood pressure, and pulse will be monitored before, during, and after the transfusion. After the transfusion, your blood may be tested to see how your body has responded. This information is not intended to replace advice given to you by your health care provider. Make sure you discuss any questions you have with your health care provider. Document Revised: 12/03/2018 Document Reviewed: 07/23/2018 Elsevier Patient Education  2023 [employer] Inc[employer]  

## 2021-09-06 ENCOUNTER — Telehealth: Payer: Self-pay

## 2021-09-06 LAB — TYPE AND SCREEN
ABO/RH(D): O POS
Antibody Screen: NEGATIVE
Unit division: 0
Unit division: 0

## 2021-09-06 LAB — BPAM RBC
Blood Product Expiration Date: 202308312359
Blood Product Expiration Date: 202308312359
ISSUE DATE / TIME: 202307260840
ISSUE DATE / TIME: 202307261108
Unit Type and Rh: 5100
Unit Type and Rh: 5100

## 2021-09-06 NOTE — Telephone Encounter (Signed)
Attempted to contact patient's spouse Charlett Nose to schedule a Palliative Care consult appointment. No answer left a message to return call.

## 2021-09-06 NOTE — Telephone Encounter (Signed)
Spoke with patient's daughter Claiborne Billings and scheduled a Mychart Palliative Consult for 09/10/21 @ 1 PM.  Consent obtained; updated [facility], Team List and Epic.

## 2021-09-07 NOTE — Progress Notes (Unsigned)
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  INTERVAL HISTORY: Patient returns to clinic today for further evaluation and continuation of blood if needed.  He feels significantly improved after receiving 2 units of packed red blood cells last week.  He continues to have significant urinary complaints.  He has chronic weakness and fatigue.  He has a poor appetite.  He has no neurologic complaints.  He denies any recent fevers.  He has no chest pain, shortness of breath, cough, or hemoptysis.  He denies any nausea, vomiting, constipation, or diarrhea.  He has no urinary complaints.  Patient offers no further specific complaints today.  REVIEW OF SYSTEMS:   Review of Systems  Constitutional:  Positive for malaise/fatigue. Negative for fever and weight loss.  Respiratory: Negative.  Negative for cough, hemoptysis and shortness of breath.   Cardiovascular: Negative.  Negative for chest pain and leg swelling.  Gastrointestinal: Negative.  Negative for abdominal pain.  Genitourinary:  Positive for frequency and urgency. Negative for dysuria and hematuria.  Musculoskeletal: Negative.  Negative for back pain.  Skin: Negative.  Negative for rash.  Neurological:  Positive for weakness. Negative for dizziness, focal weakness and headaches.  Psychiatric/Behavioral: Negative.  The patient is not nervous/anxious.     As per HPI. Otherwise, a complete review of systems is negative.  PAST MEDICAL HISTORY: Past Medical History:  Diagnosis Date   Anemia    Atherosclerosis    Back pain    High coronary artery calcium score    Hyperlipidemia    Hypogonadism in male    Lymphadenopathy    OA  (osteoarthritis)    Obesity    Osteopenia    Prostate cancer (HCC)    Prostate cancer metastatic to pelvis (HCC)    Rash, skin    LEG   Thoracic aortic aneurysm (TAA) (HCC)     PAST SURGICAL HISTORY: Past Surgical History:  Procedure Laterality Date   BELPHAROPTOSIS REPAIR     BILATERAL KNEE ARTHROSCOPY     HYDRONEPHROSIS Right    LYMPHADENOPATHY Right     FAMILY HISTORY: Family History  Problem Relation Age of Onset   Vascular Disease Mother    Cancer Mother    Heart attack Father    Diabetes Father    Healthy Sister    Heart attack Brother    Diabetes Paternal Grandmother    Other Paternal Grandfather        OLD AGE   Healthy Brother    Healthy Daughter     ADVANCED DIRECTIVES (Y/N):  N  HEALTH MAINTENANCE: Social History   Tobacco Use   Smoking status: Never   Smokeless tobacco: Never  Substance Use Topics   Alcohol use: No    Alcohol/week: 0.0 standard drinks of alcohol     Colonoscopy:  PAP:  Bone density:  Lipid panel:  Allergies  Allergen Reactions   Simvastatin Other (See Comments)    myalgia    Current Outpatient Medications  Medication Sig Dispense Refill   amoxicillin-clavulanate (AUGMENTIN) 500-125 MG tablet Take 1 tablet by mouth 3 (three) times daily.     docusate sodium (COLACE) 100 MG capsule Take 1 capsule (100 mg total) by mouth 2 (two) times daily as needed. 60 capsule 0  promethazine (PHENERGAN) 25 MG suppository Place 1 suppository (25 mg total) rectally every 6 (six) hours as needed for refractory nausea / vomiting (not responding to reglan). 12 each 0   LORazepam (ATIVAN) 0.5 MG tablet Take 1 tablet (0.5 mg total) by mouth every 8 (eight) hours as needed for anxiety (also for refractory nausea not responding to reglan and phenergan). (Patient not taking: Reported on 09/04/2021) 30 tablet 0   metoCLOPramide (REGLAN) 10 MG tablet Take 1 tablet (10 mg total) by mouth every 6 (six) hours as needed for nausea or vomiting. (Patient not  taking: Reported on 09/04/2021) 40 tablet 1   Morphine Sulfate (MORPHINE CONCENTRATE) 10 mg / 0.5 ml concentrated solution Take 0.25 mLs (5 mg total) by mouth every 2 (two) hours as needed for severe pain, moderate pain or shortness of breath. (Patient not taking: Reported on 09/04/2021) 30 mL 0   polyethylene glycol (MIRALAX / GLYCOLAX) 17 g packet Take 17 g by mouth daily. (Patient not taking: Reported on 09/04/2021) 14 each 0   prochlorperazine (COMPAZINE) 10 MG tablet Take 10 mg by mouth every 4 (four) hours as needed. (Patient not taking: Reported on 09/04/2021)     SENNA-PLUS 8.6-50 MG tablet SMARTSIG:2 Tablet(s) By Mouth Every Evening (Patient not taking: Reported on 09/04/2021)     traZODone (DESYREL) 50 MG tablet Take 1 tablet (50 mg total) by mouth at bedtime as needed for sleep. (Patient not taking: Reported on 09/04/2021) 30 tablet 0   No current facility-administered medications for this visit.    OBJECTIVE: Vitals:   09/11/21 0915 09/11/21 0917  BP:  103/66  Pulse:  84  Resp: 18   Temp: (!) 97.3 F (36.3 C)   SpO2:  94%     There is no height or weight on file to calculate BMI.    ECOG FS:3 - Symptomatic, >50% confined to bed  General: Well-developed, well-nourished, no acute distress.  Sitting in a wheelchair. Eyes: Pink conjunctiva, anicteric sclera. HEENT: Normocephalic, moist mucous membranes. Lungs: No audible wheezing or coughing. Heart: Regular rate and rhythm. Abdomen: Soft, nontender, no obvious distention. Musculoskeletal: No edema, cyanosis, or clubbing. Neuro: Alert, answering all questions appropriately. Cranial nerves grossly intact. Skin: No rashes or petechiae noted. Psych: Normal affect.   LAB RESULTS:  Lab Results  Component Value Date   NA 137 08/18/2021   K 3.7 08/18/2021   CL 111 08/18/2021   CO2 22 08/18/2021   GLUCOSE 130 (H) 08/18/2021   BUN 16 08/18/2021   CREATININE 1.00 08/18/2021   CALCIUM 7.5 (L) 08/18/2021   PROT 6.1 (L) 08/14/2021    ALBUMIN 3.6 08/14/2021   AST 35 08/14/2021   ALT 41 08/14/2021   ALKPHOS 46 08/14/2021   BILITOT 0.9 08/14/2021   GFRNONAA >60 08/18/2021   GFRAA >60 11/15/2015    Lab Results  Component Value Date   WBC 0.8 (LL) 09/10/2021   NEUTROABS 0.4 (LL) 09/10/2021   HGB 7.9 (L) 09/10/2021   HCT 23.9 (L) 09/10/2021   MCV 94.1 09/10/2021   PLT 82 (L) 09/10/2021     STUDIES: DG ABD ACUTE 2+V W 1V CHEST  Result Date: 08/18/2021 CLINICAL DATA:  Fever.  Small-bowel obstruction. EXAM: DG ABDOMEN ACUTE WITH 1 VIEW CHEST COMPARISON:  08/16/2021 FINDINGS: Low lung volumes. The cardio pericardial silhouette is enlarged. Interstitial markings are diffusely coarsened with chronic features. Basilar atelectasis bilaterally, left greater than right with possible tiny left effusion. Lucency under the right hemidiaphragm raises the question of intraperitoneal  free air. Diffuse gaseous small bowel distension suggests obstruction and is slightly progressive since 08/15/2021 exam. Numerous surgical clips overlie the pelvis. IMPRESSION: 1. Low lung volumes with bibasilar atelectasis. 2. Lucency under the right hemidiaphragm. Intraperitoneal free air not excluded. CT scan of the abdomen and pelvis could be used to further evaluate as clinically warranted. 3. Interval increase in gaseous small bowel dilatation since 08/16/2021. Evolving small bowel obstruction a concern. This could also be further assessed at CT. These results will be called to the ordering clinician or representative by the Radiologist Assistant, and communication documented in the PACS or [employer]. Electronically Signed   By: Misty Stanley M.D.   On: 08/18/2021 12:23   DG Abd 2 Views  Result Date: 08/16/2021 CLINICAL DATA:  Small bowel obstruction EXAM: ABDOMEN - 2 VIEW COMPARISON:  Yesterday FINDINGS: Less pronounced small bowel dilatation with increased colonic gas compared to scanogram on CT yesterday. No new or progressive abnormality. Pelvic  lymphadenectomy clips. IMPRESSION: Likely improved small bowel obstruction. Electronically Signed   By: Jorje Guild M.D.   On: 08/16/2021 07:36   DG Abd 2 Views  Result Date: 08/15/2021 CLINICAL DATA:  Small-bowel obstruction EXAM: ABDOMEN - 2 VIEW COMPARISON:  None Available. FINDINGS: There are a few air-filled mildly dilated loops of small bowel. Likely similar in comparison to CT topogram. No free air. Residual contrast within dilated left renal collecting system and ureter. IMPRESSION: Persistent small-bowel obstruction with likely similar distension to CT. Residual contrast within dilated left renal collecting system and ureter. Electronically Signed   By: Macy Mis M.D.   On: 08/15/2021 11:27   CT ABDOMEN PELVIS W CONTRAST  Result Date: 08/15/2021 CLINICAL DATA:  Left lower quadrant abdominal pain. EXAM: CT ABDOMEN AND PELVIS WITH CONTRAST TECHNIQUE: Multidetector CT imaging of the abdomen and pelvis was performed using the standard protocol following bolus administration of intravenous contrast. RADIATION DOSE REDUCTION: This exam was performed according to the departmental dose-optimization program which includes automated exposure control, adjustment of the mA and/or kV according to patient size and/or use of iterative reconstruction technique. CONTRAST:  132m OMNIPAQUE IOHEXOL 300 MG/ML  SOLN COMPARISON:  August 03, 2021 FINDINGS: Lower chest: No acute abnormality. Hepatobiliary: Small, stable hepatic cysts are seen. No gallstones, gallbladder wall thickening, or biliary dilatation. Pancreas: Unremarkable. No pancreatic ductal dilatation or surrounding inflammatory changes. Spleen: Multiple small, stable heterogeneous low-attenuation splenic lesions are noted. Adrenals/Urinary Tract: The right adrenal gland is normal in appearance. Nodularity of the left adrenal gland is seen. The kidneys are normal in size, bilaterally. Marked severity diffuse renal cortical thinning is seen on the right.  Small, stable simple cysts are seen within the left kidney. There is stable marked severity right-sided hydronephrosis and hydroureter with dilatation of the intrarenal calices. No renal calculi are identified. A stable approximately 9.5 cm x 5.7 cm heterogeneous lobulated soft tissue mass is seen within the posterior and lateral aspects of the urinary bladder. Stomach/Bowel: Stomach is within normal limits. The appendix is not clearly identified. Numerous dilated small bowel loops are seen within the abdomen and pelvis (maximum small bowel diameter of approximately 4.0 cm). An abrupt transition zone is seen within the anterior aspect of the lower abdomen, along the midline (axial CT images 54 through 58, CT series 2). Noninflamed diverticula are seen throughout the sigmoid colon. Vascular/Lymphatic: Aortic atherosclerosis. Stable pelvic and right inguinal lymphadenopathy is seen. Reproductive: The prostate gland is surgically absent. Other: No abdominal wall hernia or abnormality. No abdominopelvic ascites.  Musculoskeletal: There is grade 2 anterolisthesis of the L4 vertebral body on L5. Multilevel degenerative changes are seen throughout the remainder of the lumbar spine. IMPRESSION: 1. Distal small bowel obstruction with a transition zone seen within the anterior aspect of the lower abdomen, along the midline. 2. Stable marked severity right-sided hydronephrosis and hydroureter with dilatation of the intrarenal calices. 3. Stable lobulated urinary bladder mass consistent with an underlying neoplasm. Further evaluation with soft tissue sampling is recommended. 4. Sigmoid diverticulosis. 5. Stable pelvic and right inguinal lymphadenopathy. 6. Grade 2 anterolisthesis of the L4 vertebral body on L5. 7. Low attenuation splenic lesions which may represent splenic hemangiomas. An underlying neoplastic process cannot be excluded. Further evaluation with nuclear medicine PET/CT is recommended. 8. Aortic atherosclerosis.  Aortic Atherosclerosis (ICD10-I70.0). Electronically Signed   By: Virgina Norfolk M.D.   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  PLAN:    Transfusion dependent pancytopenia: Patient's pancytopenia by report is secondary to chemotherapy, but low bone marrow biopsy has been done and patient declines.  He is now transfusion dependent and previously was requiring blood transfusion approximately every 2 weeks.  Patient has elected to discontinue hospice and resume transfusions.  His hemoglobin is 7.9 today, therefore proceed with 1 unit of packed red blood cells.  Return to clinic 2 weeks for laboratory work and transfusion if his hemoglobin is below 8.0.  Patient will then return to clinic in 4 weeks for further evaluation and continuation of treatment if needed.    Metastatic prostate cancer: Patient does not wish any further treatments. Neutropenia: Patient has an [place] of 0.5 today.  This appears to be at approximate baseline.  No intervention needed.  Patient has declined bone marrow biopsy. Thrombocytopenia: Platelet count 82.  No intervention needed. Urinary complaints: Patient previously was seen by urology at [facility], he has requested referral to local physician.      Patient expressed understanding and was in agreement with this plan. He also understands that He can call clinic at any time with any questions, concerns, or complaints.    Cancer Staging  No matching staging information was found for the patient.  Lloyd Huger, MD   09/11/2021 12:28 PM

## 2021-09-10 ENCOUNTER — Encounter: Payer: Self-pay | Admitting: Urgent Care

## 2021-09-10 ENCOUNTER — Inpatient Hospital Stay: Payer: Medicare Other

## 2021-09-10 ENCOUNTER — Telehealth: Payer: Self-pay | Admitting: Hospice

## 2021-09-10 ENCOUNTER — Other Ambulatory Visit: Payer: Self-pay | Admitting: *Deleted

## 2021-09-10 ENCOUNTER — Other Ambulatory Visit: Payer: Self-pay

## 2021-09-10 ENCOUNTER — Other Ambulatory Visit: Payer: Self-pay | Admitting: Oncology

## 2021-09-10 DIAGNOSIS — K5901 Slow transit constipation: Secondary | ICD-10-CM

## 2021-09-10 DIAGNOSIS — C61 Malignant neoplasm of prostate: Secondary | ICD-10-CM | POA: Diagnosis not present

## 2021-09-10 DIAGNOSIS — D61818 Other pancytopenia: Secondary | ICD-10-CM

## 2021-09-10 DIAGNOSIS — Z515 Encounter for palliative care: Secondary | ICD-10-CM | POA: Diagnosis not present

## 2021-09-10 DIAGNOSIS — D696 Thrombocytopenia, unspecified: Secondary | ICD-10-CM | POA: Diagnosis not present

## 2021-09-10 DIAGNOSIS — C7989 Secondary malignant neoplasm of other specified sites: Secondary | ICD-10-CM | POA: Diagnosis not present

## 2021-09-10 DIAGNOSIS — D709 Neutropenia, unspecified: Secondary | ICD-10-CM | POA: Diagnosis not present

## 2021-09-10 LAB — CBC WITH DIFFERENTIAL/PLATELET
Abs Immature Granulocytes: 0 10*3/uL (ref 0.00–0.07)
Basophils Absolute: 0 10*3/uL (ref 0.0–0.1)
Basophils Relative: 0 %
Eosinophils Absolute: 0 10*3/uL (ref 0.0–0.5)
Eosinophils Relative: 3 %
HCT: 23.9 % — ABNORMAL LOW (ref 39.0–52.0)
Hemoglobin: 7.9 g/dL — ABNORMAL LOW (ref 13.0–17.0)
Immature Granulocytes: 0 %
Lymphocytes Relative: 34 %
Lymphs Abs: 0.3 10*3/uL — ABNORMAL LOW (ref 0.7–4.0)
MCH: 31.1 pg (ref 26.0–34.0)
MCHC: 33.1 g/dL (ref 30.0–36.0)
MCV: 94.1 fL (ref 80.0–100.0)
Monocytes Absolute: 0.1 10*3/uL (ref 0.1–1.0)
Monocytes Relative: 18 %
Neutro Abs: 0.4 10*3/uL — CL (ref 1.7–7.7)
Neutrophils Relative %: 45 %
Platelets: 82 10*3/uL — ABNORMAL LOW (ref 150–400)
RBC: 2.54 MIL/uL — ABNORMAL LOW (ref 4.22–5.81)
RDW: 14.9 % (ref 11.5–15.5)
Smear Review: NORMAL
WBC: 0.8 10*3/uL — CL (ref 4.0–10.5)
nRBC: 0 % (ref 0.0–0.2)

## 2021-09-10 LAB — SAMPLE TO BLOOD BANK

## 2021-09-10 NOTE — Progress Notes (Signed)
[facility] Consult Note Telephone: [phone]  Fax: [phone]  PATIENT NAME: Nicolas Woodward [address] [place] [address] [phone] (home)  DOB: 1935-05-21 MRN: [id number]  PRIMARY CARE PROVIDER:    Ginger Organ., MD,  [place] [address] [phone]  REFERRING PROVIDER:   Ginger Organ., MD [address] [place],  [place] [address] [phone]  RESPONSIBLE PARTY:   Self [place]erman Daughter [phone] Contact Information     Name Relation Home Work [place] Spouse [phone]  [phone]       TELEHEALTH VISIT STATEMENT Due to the COVID-19 crisis, this visit was done via telemedicine from my office and it was initiated and consent by this patient and or family.  I connected with patient OR PROXY by a telephone/video  and verified that I am speaking with the correct person. I discussed the limitations of evaluation and management by telemedicine. The patient expressed understanding and agreed to proceed. Palliative Care was asked to follow this patient to address advance care planning, complex medical decision making and goals of care clarification. This is the initial visit. Nicolas Woodward is with patient during visit.     ASSESSMENT AND / RECOMMENDATIONS:   Advance Care Planning: Our advance care planning conversation included a discussion about:    The value and importance of advance care planning  Difference between Hospice and Palliative care Exploration of goals of care in the event of a sudden injury or illness  Identification and preparation of a healthcare agent  Review and updating or creation of an  advance directive document . Decision not to resuscitate or to de-escalate disease focused treatments due to poor prognosis.  CODE STATUS:Patient affirmed he is a Do Not Resuscitate  Goals of Care: Goals include to maximize quality of life  and symptom management.  Family is open to discussion on hospice service in the future.  I spent  16 minutes providing this initial consultation. More than 50% of the time in this consultation was spent on counseling patient and coordinating communication. --------------------------------------------------------------------------------------------------------------------------------------  Symptom Management/Plan: Prostate Cancer: mets to pelvic. Completed chemo radiation. No plans for further treatment.  Oncology surveillance is ongoing Pancytopenia: Continue blood infusion as ordered, next is 09/11/21.  Constipation: Continue Senna S, Miralax.  Nausea: Continue Compazine. Routine CBC CMP.   Follow up: Palliative care will continue to follow for complex medical decision making, advance care planning, and clarification of goals. Return 6 weeks or prn. Encouraged to call provider sooner with any concerns.   Family /Caregiver/Community Supports: Nicolas Woodward is involved in patient's care.  HOSPICE ELIGIBILITY/DIAGNOSIS: TBD  Chief Complaint: Initial Palliative care visit  HISTORY OF PRESENT ILLNESS:  Nicolas Woodward is a 86 y.o. year old male  with multiple morbidities requiring close monitoring and with high risk of complications and  mortality: Prostate cancer with metastasis to pelvis, pancytopenia, constipation, nausea..   History obtained from review of EMR, discussion with primary team, caregiver, family and/or Nicolas Woodward.  Review and summarization of Epic records shows history from other than patient. Rest of 10 point ROS asked and negative.  Independent interpretation of tests and reviewed as needed, available labs, patient records, imaging, studies and related documents from the EMR.  Recent Labs  Lab 09/04/21 1413 09/10/21 1046  WBC 1.2* 0.8*  HGB 5.9* 7.9*  HCT 17.8* 23.9*  PLT 119* 82*  MCV 97.3 94.1  PAST MEDICAL HISTORY:  Active Ambulatory Problems    Diagnosis Date Noted    Ingrown nail 05/16/2015   Paronychia of great toe, left 05/16/2015   Plantar fasciitis of right foot 07/05/2015   Pulmonary embolism ([facility]) 11/14/2015   Pleuritic chest pain 11/14/2015   RUQ abdominal pain 11/14/2015   History of prostate cancer 11/14/2015   Thrombocytopenia ([facility]) 11/15/2015   SBO (small bowel obstruction) ([facility]) 08/15/2021   Prostate cancer metastatic to pelvis ([facility]) 08/15/2021   Hyperlipidemia 08/15/2021   Bladder mass 08/15/2021   Pancytopenia ([facility]) 08/15/2021   UTI (urinary tract infection) 08/15/2021   Hospice care patient 08/18/2021   Resolved Ambulatory Problems    Diagnosis Date Noted   No Resolved Ambulatory Problems   Past Medical History:  Diagnosis Date   Anemia    Atherosclerosis    Back pain    High coronary artery calcium score    Hypogonadism in male    Lymphadenopathy    OA (osteoarthritis)    Obesity    Osteopenia    Prostate cancer (HCC)    Rash, skin    Thoracic aortic aneurysm (TAA) (HCC)     SOCIAL HX:  Social History   Tobacco Use   Smoking status: Never   Smokeless tobacco: Never  Substance Use Topics   Alcohol use: No    Alcohol/week: 0.0 standard drinks of alcohol     FAMILY HX:  Family History  Problem Relation Age of Onset   Vascular Disease Mother    Cancer Mother    Heart attack Father    Diabetes Father    Healthy Sister    Heart attack Brother    Diabetes Paternal Grandmother    Other Paternal Grandfather        OLD AGE   Healthy Brother    Healthy Daughter       ALLERGIES:  Allergies  Allergen Reactions   Simvastatin Other (See Comments)    myalgia      PERTINENT MEDICATIONS:  Outpatient Encounter Medications as of 09/10/2021  Medication Sig   amoxicillin-clavulanate (AUGMENTIN) 500-125 MG tablet Take 1 tablet by mouth 3 (three) times daily.   docusate sodium (COLACE) 100 MG capsule Take 1 capsule (100 mg total) by mouth 2 (two) times daily as needed.   LORazepam (ATIVAN) 0.5 MG tablet Take 1  tablet (0.5 mg total) by mouth every 8 (eight) hours as needed for anxiety (also for refractory nausea not responding to reglan and phenergan). (Patient not taking: Reported on 09/04/2021)   metoCLOPramide (REGLAN) 10 MG tablet Take 1 tablet (10 mg total) by mouth every 6 (six) hours as needed for nausea or vomiting. (Patient not taking: Reported on 09/04/2021)   Morphine Sulfate (MORPHINE CONCENTRATE) 10 mg / 0.5 ml concentrated solution Take 0.25 mLs (5 mg total) by mouth every 2 (two) hours as needed for severe pain, moderate pain or shortness of breath. (Patient not taking: Reported on 09/04/2021)   polyethylene glycol (MIRALAX / GLYCOLAX) 17 g packet Take 17 g by mouth daily. (Patient not taking: Reported on 09/04/2021)   prochlorperazine (COMPAZINE) 10 MG tablet Take 10 mg by mouth every 4 (four) hours as needed. (Patient not taking: Reported on 09/04/2021)   promethazine (PHENERGAN) 25 MG suppository Place 1 suppository (25 mg total) rectally every 6 (six) hours as needed for refractory nausea / vomiting (not responding to reglan).   SENNA-PLUS 8.6-50 MG tablet SMARTSIG:2 Tablet(s) By Mouth Every Evening (Patient not taking: Reported on 09/04/2021)   traZODone (  DESYREL) 50 MG tablet Take 1 tablet (50 mg total) by mouth at bedtime as needed for sleep. (Patient not taking: Reported on 09/04/2021)   No facility-administered encounter medications on file as of 09/10/2021.     Thank you for the opportunity to participate in the care of Nicolas Woodward.  The palliative care team will continue to follow. Please call our office at [phone] if we can be of additional assistance.   Note: Portions of this note were generated with [employer]. Dictation errors may occur despite best attempts at proofreading.  Teodoro Spray, NP

## 2021-09-11 ENCOUNTER — Inpatient Hospital Stay: Payer: Medicare HMO

## 2021-09-11 ENCOUNTER — Encounter: Payer: Self-pay | Admitting: Oncology

## 2021-09-11 ENCOUNTER — Inpatient Hospital Stay (HOSPITAL_BASED_OUTPATIENT_CLINIC_OR_DEPARTMENT_OTHER): Payer: Medicare HMO | Admitting: Hospice and Palliative Medicine

## 2021-09-11 ENCOUNTER — Other Ambulatory Visit: Payer: Medicare HMO

## 2021-09-11 ENCOUNTER — Inpatient Hospital Stay: Payer: Medicare HMO | Attending: Oncology | Admitting: Oncology

## 2021-09-11 VITALS — BP 101/65 | HR 80 | Temp 97.0°F | Resp 19

## 2021-09-11 VITALS — BP 103/66 | HR 84 | Temp 97.3°F | Resp 18

## 2021-09-11 DIAGNOSIS — D709 Neutropenia, unspecified: Secondary | ICD-10-CM | POA: Diagnosis not present

## 2021-09-11 DIAGNOSIS — C61 Malignant neoplasm of prostate: Secondary | ICD-10-CM | POA: Diagnosis present

## 2021-09-11 DIAGNOSIS — D61818 Other pancytopenia: Secondary | ICD-10-CM | POA: Diagnosis not present

## 2021-09-11 DIAGNOSIS — T451X5A Adverse effect of antineoplastic and immunosuppressive drugs, initial encounter: Secondary | ICD-10-CM | POA: Insufficient documentation

## 2021-09-11 DIAGNOSIS — R351 Nocturia: Secondary | ICD-10-CM | POA: Diagnosis not present

## 2021-09-11 DIAGNOSIS — D696 Thrombocytopenia, unspecified: Secondary | ICD-10-CM | POA: Insufficient documentation

## 2021-09-11 LAB — PREPARE RBC (CROSSMATCH)

## 2021-09-11 MED ORDER — SODIUM CHLORIDE 0.9% IV SOLUTION
250.0000 mL | Freq: Once | INTRAVENOUS | Status: AC
  Administered 2021-09-11: 250 mL via INTRAVENOUS
  Filled 2021-09-11: qty 250

## 2021-09-11 NOTE — Progress Notes (Signed)
Patient here today for follow up regarding pancytopenia, blood transfusion. Patient reports overall improvement in weakness.

## 2021-09-11 NOTE — Progress Notes (Signed)
Symptom Management and [place] at [facility] Telephone:([phone]) [phone] [phone]  Patient Care Team: Ginger Organ., MD as PCP - General (Internal Medicine)   Name of the patient: Nicolas Woodward  [id number]  August 24, 1935   Date of visit: 09/11/21  Reason for Consult: Nicolas Woodward is a 86 y.o. male with multiple medical problems including metastatic prostate cancer, pancytopenia with transfusion dependence.  Patient has been historically followed by [facility] oncology most recently on treatment with Pluvicto the patient has been on multiple previous lines of chemotherapy dating back 20+ years.  Worsening cytopenias resulted in discontinuation of Pluvicto.  Patient was referred to malignant hematology for further evaluation.  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  Patient was ultimately discharged home with hospice care.  Patient requested to be seen in our cancer center for consideration of resumption of transfusions.  Interval History: Patient was accompanied by his wife.  Patient reports feeling significant improvement overall since last transfusion.  He denies bleeding or other symptomatic complaints or concerns.  Denies any neurologic complaints. Denies recent fevers or illnesses. Denies any easy bleeding or bruising. Reports fair appetite and denies weight loss. Denies chest pain. Denies any nausea, vomiting, constipation, or diarrhea. Denies urinary complaints. Patient offers no further specific complaints today.  SOCIAL HISTORY:     reports that he has never smoked. He has never used smokeless tobacco. He reports that he does not drink alcohol.  Patient is married and lives at home with his wife and daughter.  He previously worked as a Dealer.  ADVANCE DIRECTIVES:    CODE STATUS: DNR   PAST MEDICAL HISTORY: Past Medical History:  Diagnosis Date   Anemia    Atherosclerosis     Back pain    High coronary artery calcium score    Hyperlipidemia    Hypogonadism in male    Lymphadenopathy    OA (osteoarthritis)    Obesity    Osteopenia    Prostate cancer ([place])    Prostate cancer metastatic to pelvis (HCC)    Rash, skin    LEG   Thoracic aortic aneurysm (TAA) (HCC)     PAST SURGICAL HISTORY:  Past Surgical History:  Procedure Laterality Date   BELPHAROPTOSIS REPAIR     BILATERAL KNEE ARTHROSCOPY     HYDRONEPHROSIS Right    LYMPHADENOPATHY Right     HEMATOLOGY/ONCOLOGY HISTORY:  Oncology History   No history exists.    ALLERGIES:  is allergic to simvastatin.  MEDICATIONS:  Current Outpatient Medications  Medication Sig Dispense Refill   amoxicillin-clavulanate (AUGMENTIN) 500-125 MG tablet Take 1 tablet by mouth 3 (three) times daily.     docusate sodium (COLACE) 100 MG capsule Take 1 capsule (100 mg total) by mouth 2 (two) times daily as needed. 60 capsule 0   LORazepam (ATIVAN) 0.5 MG tablet Take 1 tablet (0.5 mg total) by mouth every 8 (eight) hours as needed for anxiety (also for refractory nausea not responding to reglan and phenergan). (Patient not taking: Reported on 09/04/2021) 30 tablet 0   metoCLOPramide (REGLAN) 10 MG tablet Take 1 tablet (10 mg total) by mouth every 6 (six) hours as needed for nausea or vomiting. (Patient not taking: Reported on 09/04/2021) 40 tablet 1   Morphine Sulfate (MORPHINE CONCENTRATE) 10 mg / 0.5 ml concentrated solution Take 0.25 mLs (5 mg total) by mouth every 2 (two) hours as needed for severe pain, moderate pain or  shortness of breath. (Patient not taking: Reported on 09/04/2021) 30 mL 0   polyethylene glycol (MIRALAX / GLYCOLAX) 17 g packet Take 17 g by mouth daily. (Patient not taking: Reported on 09/04/2021) 14 each 0   prochlorperazine (COMPAZINE) 10 MG tablet Take 10 mg by mouth every 4 (four) hours as needed. (Patient not taking: Reported on 09/04/2021)     promethazine (PHENERGAN) 25 MG suppository Place 1  suppository (25 mg total) rectally every 6 (six) hours as needed for refractory nausea / vomiting (not responding to reglan). 12 each 0   SENNA-PLUS 8.6-50 MG tablet SMARTSIG:2 Tablet(s) By Mouth Every Evening (Patient not taking: Reported on 09/04/2021)     traZODone (DESYREL) 50 MG tablet Take 1 tablet (50 mg total) by mouth at bedtime as needed for sleep. (Patient not taking: Reported on 09/04/2021) 30 tablet 0   No current facility-administered medications for this visit.    VITAL SIGNS: BP 101/65   Pulse 80   Temp (!) 97 F (36.1 C) (Tympanic)   Resp 19  There were no vitals filed for this visit.  Estimated body mass index is 31.87 kg/m as calculated from the following:   Height as of 09/04/21: 6' (1.829 m).   Weight as of 09/04/21: 235 lb (106.6 kg).  LABS: CBC:    Component Value Date/Time   WBC 0.8 (LL) 09/10/2021 1046   HGB 7.9 (L) 09/10/2021 1046   HCT 23.9 (L) 09/10/2021 1046   PLT 82 (L) 09/10/2021 1046   MCV 94.1 09/10/2021 1046   NEUTROABS 0.4 (LL) 09/10/2021 1046   LYMPHSABS 0.3 (L) 09/10/2021 1046   MONOABS 0.1 09/10/2021 1046   EOSABS 0.0 09/10/2021 1046   BASOSABS 0.0 09/10/2021 1046   Comprehensive Metabolic Panel:    Component Value Date/Time   NA 137 08/18/2021 0721   K 3.7 08/18/2021 0721   CL 111 08/18/2021 0721   CO2 22 08/18/2021 0721   BUN 16 08/18/2021 0721   CREATININE 1.00 08/18/2021 0721   GLUCOSE 130 (H) 08/18/2021 0721   CALCIUM 7.5 (L) 08/18/2021 0721   AST 35 08/14/2021 2141   ALT 41 08/14/2021 2141   ALKPHOS 46 08/14/2021 2141   BILITOT 0.9 08/14/2021 2141   PROT 6.1 (L) 08/14/2021 2141   ALBUMIN 3.6 08/14/2021 2141    RADIOGRAPHIC STUDIES: DG ABD ACUTE 2+V W 1V CHEST  Result Date: 08/18/2021 CLINICAL DATA:  Fever.  Small-bowel obstruction. EXAM: DG ABDOMEN ACUTE WITH 1 VIEW CHEST COMPARISON:  08/16/2021 FINDINGS: Low lung volumes. The cardio pericardial silhouette is enlarged. Interstitial markings are diffusely coarsened with  chronic features. Basilar atelectasis bilaterally, left greater than right with possible tiny left effusion. Lucency under the right hemidiaphragm raises the question of intraperitoneal free air. Diffuse gaseous small bowel distension suggests obstruction and is slightly progressive since 08/15/2021 exam. Numerous surgical clips overlie the pelvis. IMPRESSION: 1. Low lung volumes with bibasilar atelectasis. 2. Lucency under the right hemidiaphragm. Intraperitoneal free air not excluded. CT scan of the abdomen and pelvis could be used to further evaluate as clinically warranted. 3. Interval increase in gaseous small bowel dilatation since 08/16/2021. Evolving small bowel obstruction a concern. This could also be further assessed at CT. These results will be called to the ordering clinician or representative by the Radiologist Assistant, and communication documented in the PACS or [employer]. Electronically Signed   By: Misty Stanley M.D.   On: 08/18/2021 12:23   DG Abd 2 Views  Result Date: 08/16/2021 CLINICAL DATA:  Small bowel obstruction EXAM: ABDOMEN - 2 VIEW COMPARISON:  Yesterday FINDINGS: Less pronounced small bowel dilatation with increased colonic gas compared to scanogram on CT yesterday. No new or progressive abnormality. Pelvic lymphadenectomy clips. IMPRESSION: Likely improved small bowel obstruction. Electronically Signed   By: Jorje Guild M.D.   On: 08/16/2021 07:36   DG Abd 2 Views  Result Date: 08/15/2021 CLINICAL DATA:  Small-bowel obstruction EXAM: ABDOMEN - 2 VIEW COMPARISON:  None Available. FINDINGS: There are a few air-filled mildly dilated loops of small bowel. Likely similar in comparison to CT topogram. No free air. Residual contrast within dilated left renal collecting system and ureter. IMPRESSION: Persistent small-bowel obstruction with likely similar distension to CT. Residual contrast within dilated left renal collecting system and ureter. Electronically Signed   By:  Macy Mis M.D.   On: 08/15/2021 11:27   CT ABDOMEN PELVIS W CONTRAST  Result Date: 08/15/2021 CLINICAL DATA:  Left lower quadrant abdominal pain. EXAM: CT ABDOMEN AND PELVIS WITH CONTRAST TECHNIQUE: Multidetector CT imaging of the abdomen and pelvis was performed using the standard protocol following bolus administration of intravenous contrast. RADIATION DOSE REDUCTION: This exam was performed according to the departmental dose-optimization program which includes automated exposure control, adjustment of the mA and/or kV according to patient size and/or use of iterative reconstruction technique. CONTRAST:  121m OMNIPAQUE IOHEXOL 300 MG/ML  SOLN COMPARISON:  August 03, 2021 FINDINGS: Lower chest: No acute abnormality. Hepatobiliary: Small, stable hepatic cysts are seen. No gallstones, gallbladder wall thickening, or biliary dilatation. Pancreas: Unremarkable. No pancreatic ductal dilatation or surrounding inflammatory changes. Spleen: Multiple small, stable heterogeneous low-attenuation splenic lesions are noted. Adrenals/Urinary Tract: The right adrenal gland is normal in appearance. Nodularity of the left adrenal gland is seen. The kidneys are normal in size, bilaterally. Marked severity diffuse renal cortical thinning is seen on the right. Small, stable simple cysts are seen within the left kidney. There is stable marked severity right-sided hydronephrosis and hydroureter with dilatation of the intrarenal calices. No renal calculi are identified. A stable approximately 9.5 cm x 5.7 cm heterogeneous lobulated soft tissue mass is seen within the posterior and lateral aspects of the urinary bladder. Stomach/Bowel: Stomach is within normal limits. The appendix is not clearly identified. Numerous dilated small bowel loops are seen within the abdomen and pelvis (maximum small bowel diameter of approximately 4.0 cm). An abrupt transition zone is seen within the anterior aspect of the lower abdomen, along the  midline (axial CT images 54 through 58, CT series 2). Noninflamed diverticula are seen throughout the sigmoid colon. Vascular/Lymphatic: Aortic atherosclerosis. Stable pelvic and right inguinal lymphadenopathy is seen. Reproductive: The prostate gland is surgically absent. Other: No abdominal wall hernia or abnormality. No abdominopelvic ascites. Musculoskeletal: There is grade 2 anterolisthesis of the L4 vertebral body on L5. Multilevel degenerative changes are seen throughout the remainder of the lumbar spine. IMPRESSION: 1. Distal small bowel obstruction with a transition zone seen within the anterior aspect of the lower abdomen, along the midline. 2. Stable marked severity right-sided hydronephrosis and hydroureter with dilatation of the intrarenal calices. 3. Stable lobulated urinary bladder mass consistent with an underlying neoplasm. Further evaluation with soft tissue sampling is recommended. 4. Sigmoid diverticulosis. 5. Stable pelvic and right inguinal lymphadenopathy. 6. Grade 2 anterolisthesis of the L4 vertebral body on L5. 7. Low attenuation splenic lesions which may represent splenic hemangiomas. An underlying neoplastic process cannot be excluded. Further evaluation with nuclear medicine PET/CT is recommended. 8. Aortic atherosclerosis. Aortic Atherosclerosis (ICD10-I70.0).  Electronically Signed   By: Virgina Norfolk M.D.   On: 08/15/2021 01:49    PERFORMANCE STATUS (ECOG) : 2 - Symptomatic, <50% confined to bed  Review of Systems Unless otherwise noted, a complete review of systems is negative.  Physical Exam General: Frail appearing, pale Pulmonary: Unlabored Extremities: no edema, no joint deformities Skin: no rashes Neurological: Weakness but otherwise nonfocal  IMPRESSION: Patient reports feeling significant improvement in overall symptoms since receiving last transfusion.  He denies any symptomatic complaints or concerns today.  His goals are still aligned with continued  transfusions for now and he is interested in being followed by community palliative care.  That referral is pending.  Plan is for follow-up here every other week for labs/transfusions for now.  It is very likely that patient will rapidly reach a point where transfusions are ineffective, at which time reenrollment with hospice would be beneficial.  PLAN: -Continue current scope of treatment -Pending community palliative care referral -RTC in 2 weeks for blood and then to see Dr. Grayland Ormond in 4 weeks and then to see me in 8 weeks    Case and plan discussed with Dr. Grayland Ormond  Patient expressed understanding and was in agreement with this plan. He also understands that He can call clinic at any time with any questions, concerns, or complaints.   Thank you for allowing me to participate in the care of this very pleasant patient.   Time Total: 15 minutes  Visit consisted of counseling and education dealing with the complex and emotionally intense issues of symptom management in the setting of serious illness.Greater than 50%  of this time was spent counseling and coordinating care related to the above assessment and plan.  Signed by: Altha Harm, PhD, NP-C

## 2021-09-12 ENCOUNTER — Other Ambulatory Visit: Payer: Self-pay | Admitting: Hospice and Palliative Medicine

## 2021-09-12 ENCOUNTER — Telehealth: Payer: Self-pay | Admitting: *Deleted

## 2021-09-12 DIAGNOSIS — R531 Weakness: Secondary | ICD-10-CM

## 2021-09-12 DIAGNOSIS — D61818 Other pancytopenia: Secondary | ICD-10-CM

## 2021-09-12 LAB — BPAM RBC
Blood Product Expiration Date: 202309022359
Blood Product Expiration Date: 202309022359
ISSUE DATE / TIME: 202308011032
Unit Type and Rh: 5100
Unit Type and Rh: 5100

## 2021-09-12 LAB — TYPE AND SCREEN
ABO/RH(D): O POS
Antibody Screen: NEGATIVE
Unit division: 0
Unit division: 0

## 2021-09-12 NOTE — Progress Notes (Signed)
I spoke with patient's daughter.  She is requesting home health involvement since recent revocation of hospice.  We will send home health referral for PT/RN/aide.

## 2021-09-12 NOTE — Telephone Encounter (Signed)
Daughter called asking that a home health referral be made on this patient. He is no longer on Hospice because he wanted to be able to get blood transfusions when needed and "Palliative Care is not taking care of his needs." She states that he needs a nurse to evaluate him on a weekly basis. He is weak, his is having to get up every 2 hours during night to change his depends and has decided that he wants a catheter so that he can rest at night. He needs enemas weekly for the obstruction/ constipation he has and "other things like that" He gets nauseated if he has to ride in a vehicle to go to appts and she feels this could prevent him having to travel to appts as well. Please advise/ call her back

## 2021-09-12 NOTE — Telephone Encounter (Signed)
VM left for daughter.

## 2021-09-14 ENCOUNTER — Telehealth: Payer: Self-pay | Admitting: *Deleted

## 2021-09-14 NOTE — Telephone Encounter (Signed)
Returned daughter's phone #- Cheryle Horsfall [phone]. Daughter called triage to inquire any updates on [facility] referral. I contacted Floydene Flock with [facility]. The referral was transferred to [facility]. Corene Cornea will reach out to [facility] to let the agency know that pt/family have not heard from [facility].  Daughter Claiborne Billings contacted with the update on the [facility] referral and given [facility] phone #. She thanked me for calling her back.

## 2021-09-24 ENCOUNTER — Inpatient Hospital Stay: Payer: Medicare HMO

## 2021-09-24 ENCOUNTER — Other Ambulatory Visit: Payer: Self-pay | Admitting: Oncology

## 2021-09-24 DIAGNOSIS — D709 Neutropenia, unspecified: Secondary | ICD-10-CM | POA: Diagnosis not present

## 2021-09-24 DIAGNOSIS — T451X5A Adverse effect of antineoplastic and immunosuppressive drugs, initial encounter: Secondary | ICD-10-CM | POA: Diagnosis not present

## 2021-09-24 DIAGNOSIS — D61818 Other pancytopenia: Secondary | ICD-10-CM

## 2021-09-24 DIAGNOSIS — C61 Malignant neoplasm of prostate: Secondary | ICD-10-CM | POA: Diagnosis not present

## 2021-09-24 DIAGNOSIS — D696 Thrombocytopenia, unspecified: Secondary | ICD-10-CM | POA: Diagnosis not present

## 2021-09-24 LAB — CBC WITH DIFFERENTIAL/PLATELET
Abs Immature Granulocytes: 0 10*3/uL (ref 0.00–0.07)
Basophils Absolute: 0 10*3/uL (ref 0.0–0.1)
Basophils Relative: 0 %
Eosinophils Absolute: 0 10*3/uL (ref 0.0–0.5)
Eosinophils Relative: 0 %
HCT: 20.7 % — ABNORMAL LOW (ref 39.0–52.0)
Hemoglobin: 6.8 g/dL — ABNORMAL LOW (ref 13.0–17.0)
Immature Granulocytes: 0 %
Lymphocytes Relative: 38 %
Lymphs Abs: 0.2 10*3/uL — ABNORMAL LOW (ref 0.7–4.0)
MCH: 30 pg (ref 26.0–34.0)
MCHC: 32.9 g/dL (ref 30.0–36.0)
MCV: 91.2 fL (ref 80.0–100.0)
Monocytes Absolute: 0.1 10*3/uL (ref 0.1–1.0)
Monocytes Relative: 13 %
Neutro Abs: 0.3 10*3/uL — CL (ref 1.7–7.7)
Neutrophils Relative %: 49 %
Platelets: 68 10*3/uL — ABNORMAL LOW (ref 150–400)
RBC: 2.27 MIL/uL — ABNORMAL LOW (ref 4.22–5.81)
RDW: 15.9 % — ABNORMAL HIGH (ref 11.5–15.5)
Smear Review: NORMAL
WBC: 0.6 10*3/uL — CL (ref 4.0–10.5)
nRBC: 0 % (ref 0.0–0.2)

## 2021-09-24 LAB — SAMPLE TO BLOOD BANK

## 2021-09-24 LAB — PREPARE RBC (CROSSMATCH)

## 2021-09-25 ENCOUNTER — Inpatient Hospital Stay: Payer: Medicare HMO

## 2021-09-25 DIAGNOSIS — T451X5A Adverse effect of antineoplastic and immunosuppressive drugs, initial encounter: Secondary | ICD-10-CM | POA: Diagnosis not present

## 2021-09-25 DIAGNOSIS — D61818 Other pancytopenia: Secondary | ICD-10-CM | POA: Diagnosis not present

## 2021-09-25 DIAGNOSIS — D709 Neutropenia, unspecified: Secondary | ICD-10-CM | POA: Diagnosis not present

## 2021-09-25 DIAGNOSIS — C61 Malignant neoplasm of prostate: Secondary | ICD-10-CM | POA: Diagnosis not present

## 2021-09-25 DIAGNOSIS — D696 Thrombocytopenia, unspecified: Secondary | ICD-10-CM | POA: Diagnosis not present

## 2021-09-25 MED ORDER — SODIUM CHLORIDE 0.9% IV SOLUTION
250.0000 mL | Freq: Once | INTRAVENOUS | Status: AC
  Administered 2021-09-25: 250 mL via INTRAVENOUS
  Filled 2021-09-25: qty 250

## 2021-09-25 MED ORDER — DIPHENHYDRAMINE HCL 50 MG/ML IJ SOLN: 25.0000 mg | Freq: Once | INTRAMUSCULAR | Status: DC

## 2021-09-25 MED ORDER — ACETAMINOPHEN 325 MG PO TABS: 650.0000 mg | ORAL_TABLET | Freq: Once | ORAL | Status: DC

## 2021-09-26 LAB — BPAM RBC
Blood Product Expiration Date: 202309082359
Blood Product Expiration Date: 202309092359
ISSUE DATE / TIME: 202308150933
ISSUE DATE / TIME: 202308151123
ISSUING PHYSICIAN: NEGATIVE
ISSUING PHYSICIAN: NEGATIVE
Unit Type and Rh: 5100
Unit Type and Rh: 5100

## 2021-09-26 LAB — TYPE AND SCREEN
ABO/RH(D): O POS
Antibody Screen: NEGATIVE
Unit division: 0
Unit division: 0

## 2021-09-28 DIAGNOSIS — C7989 Secondary malignant neoplasm of other specified sites: Secondary | ICD-10-CM | POA: Diagnosis not present

## 2021-09-28 DIAGNOSIS — N3289 Other specified disorders of bladder: Secondary | ICD-10-CM | POA: Diagnosis not present

## 2021-09-28 DIAGNOSIS — N398 Other specified disorders of urinary system: Secondary | ICD-10-CM | POA: Diagnosis not present

## 2021-09-28 DIAGNOSIS — R1900 Intra-abdominal and pelvic swelling, mass and lump, unspecified site: Secondary | ICD-10-CM | POA: Diagnosis not present

## 2021-09-28 DIAGNOSIS — N320 Bladder-neck obstruction: Secondary | ICD-10-CM | POA: Diagnosis not present

## 2021-09-28 DIAGNOSIS — N1330 Unspecified hydronephrosis: Secondary | ICD-10-CM | POA: Diagnosis not present

## 2021-09-28 DIAGNOSIS — C61 Malignant neoplasm of prostate: Secondary | ICD-10-CM | POA: Diagnosis not present

## 2021-09-28 DIAGNOSIS — C679 Malignant neoplasm of bladder, unspecified: Secondary | ICD-10-CM | POA: Diagnosis not present

## 2021-09-30 DIAGNOSIS — R404 Transient alteration of awareness: Secondary | ICD-10-CM | POA: Diagnosis not present

## 2021-09-30 DIAGNOSIS — R69 Illness, unspecified: Secondary | ICD-10-CM | POA: Diagnosis not present

## 2021-10-01 ENCOUNTER — Telehealth: Payer: Self-pay | Admitting: *Deleted

## 2021-10-01 NOTE — Telephone Encounter (Signed)
Patient daughter called to report that patient is deceased. She states that he took his own life yesterday and asked that his appointments be cancelled

## 2021-10-08 ENCOUNTER — Other Ambulatory Visit: Payer: Medicare HMO

## 2021-10-09 ENCOUNTER — Ambulatory Visit: Payer: Medicare HMO

## 2021-10-09 ENCOUNTER — Ambulatory Visit: Payer: Medicare HMO | Admitting: Oncology

## 2021-10-12 DEATH — deceased
